# Patient Record
Sex: Male | Born: 1955
Health system: Southern US, Community
[De-identification: ages and names within clinical notes are randomized; demographics above are authoritative.]

## PROBLEM LIST (undated history)

## (undated) DIAGNOSIS — F988 Other specified behavioral and emotional disorders with onset usually occurring in childhood and adolescence: Secondary | ICD-10-CM

## (undated) DIAGNOSIS — B269 Mumps without complication: Secondary | ICD-10-CM

## (undated) DIAGNOSIS — K635 Polyp of colon: Secondary | ICD-10-CM

## (undated) DIAGNOSIS — H9319 Tinnitus, unspecified ear: Secondary | ICD-10-CM

## (undated) DIAGNOSIS — R030 Elevated blood-pressure reading, without diagnosis of hypertension: Secondary | ICD-10-CM

## (undated) DIAGNOSIS — H8110 Benign paroxysmal vertigo, unspecified ear: Secondary | ICD-10-CM

## (undated) DIAGNOSIS — B019 Varicella without complication: Secondary | ICD-10-CM

## (undated) DIAGNOSIS — J301 Allergic rhinitis due to pollen: Secondary | ICD-10-CM

## (undated) DIAGNOSIS — R739 Hyperglycemia, unspecified: Secondary | ICD-10-CM

## (undated) DIAGNOSIS — B059 Measles without complication: Secondary | ICD-10-CM

## (undated) DIAGNOSIS — Z0000 Encounter for general adult medical examination without abnormal findings: Secondary | ICD-10-CM

## (undated) HISTORY — DX: Polyp of colon: K63.5

## (undated) HISTORY — DX: Allergic rhinitis due to pollen: J30.1

## (undated) HISTORY — DX: Varicella without complication: B01.9

## (undated) HISTORY — DX: Elevated blood-pressure reading, without diagnosis of hypertension: R03.0

## (undated) HISTORY — DX: Tinnitus, unspecified ear: H93.19

## (undated) HISTORY — DX: Encounter for general adult medical examination without abnormal findings: Z00.00

## (undated) HISTORY — DX: Measles without complication: B05.9

## (undated) HISTORY — DX: Mumps without complication: B26.9

## (undated) HISTORY — DX: Benign paroxysmal vertigo, unspecified ear: H81.10

## (undated) HISTORY — DX: Other specified behavioral and emotional disorders with onset usually occurring in childhood and adolescence: F98.8

## (undated) HISTORY — DX: Hyperglycemia, unspecified: R73.9

---

## 2002-01-11 HISTORY — PX: OTHER SURGICAL HISTORY: SHX169

## 2005-01-06 ENCOUNTER — Ambulatory Visit: Payer: Self-pay | Admitting: Gastroenterology

## 2005-01-18 ENCOUNTER — Ambulatory Visit: Payer: Self-pay | Admitting: Gastroenterology

## 2009-05-14 ENCOUNTER — Telehealth: Payer: Self-pay | Admitting: Gastroenterology

## 2010-02-10 NOTE — Progress Notes (Signed)
Summary: Schedule Colonoscopy  Phone Note Outgoing Call Call back at [facility] Phone [phone]   Call placed by: Harlow Mares CMA Duncan Dull),  May 14, 2009 11:20 AM Call placed to: Patient Summary of Call: Left message on patients machine to call back. pt due for colonoscopy Initial call taken by: Harlow Mares CMA Duncan Dull),  May 14, 2009 11:20 AM  Follow-up for Phone Call        patient has changed practice Follow-up by: Harlow Mares CMA Duncan Dull),  May 26, 2009 11:38 AM

## 2012-07-24 ENCOUNTER — Ambulatory Visit: Payer: Self-pay | Admitting: Family Medicine

## 2012-07-25 ENCOUNTER — Ambulatory Visit (INDEPENDENT_AMBULATORY_CARE_PROVIDER_SITE_OTHER): Payer: BC Managed Care – PPO | Admitting: Family Medicine

## 2012-07-25 ENCOUNTER — Encounter: Payer: Self-pay | Admitting: Family Medicine

## 2012-07-25 VITALS — BP 138/90 | HR 62 | Temp 97.9°F | Ht 75.0 in | Wt 220.0 lb

## 2012-07-25 DIAGNOSIS — J301 Allergic rhinitis due to pollen: Secondary | ICD-10-CM | POA: Insufficient documentation

## 2012-07-25 DIAGNOSIS — G8929 Other chronic pain: Secondary | ICD-10-CM

## 2012-07-25 DIAGNOSIS — D126 Benign neoplasm of colon, unspecified: Secondary | ICD-10-CM

## 2012-07-25 DIAGNOSIS — Z0000 Encounter for general adult medical examination without abnormal findings: Secondary | ICD-10-CM

## 2012-07-25 DIAGNOSIS — J309 Allergic rhinitis, unspecified: Secondary | ICD-10-CM

## 2012-07-25 DIAGNOSIS — M25569 Pain in unspecified knee: Secondary | ICD-10-CM

## 2012-07-25 DIAGNOSIS — K635 Polyp of colon: Secondary | ICD-10-CM | POA: Insufficient documentation

## 2012-07-25 HISTORY — DX: Encounter for general adult medical examination without abnormal findings: Z00.00

## 2012-07-25 MED ORDER — IBUPROFEN 200 MG PO TABS: 200.0000 mg | ORAL_TABLET | Freq: Four times a day (QID) | ORAL | Status: AC | PRN

## 2012-07-25 NOTE — Patient Instructions (Addendum)
Krill oil cap daily MegaRed cap daily by [employer] Add an Aspirin Enteric coated 81 mg daily Salon Pas patches or cream  Preventive Care for Adults, Male A healthy lifestyle and preventive care can promote health and wellness. Preventive health guidelines for men include the following key practices:  A routine yearly physical is a good way to check with your caregiver about your health and preventative screening. It is a chance to share any concerns and updates on your health, and to receive a thorough exam.  Visit your dentist for a routine exam and preventative care every 6 months. Brush your teeth twice a day and floss once a day. Good oral hygiene prevents tooth decay and gum disease.  The frequency of eye exams is based on your age, health, family medical history, use of contact lenses, and other factors. Follow your caregiver's recommendations for frequency of eye exams.  Eat a healthy diet. Foods like vegetables, fruits, whole grains, low-fat dairy products, and lean protein foods contain the nutrients you need without too many calories. Decrease your intake of foods high in solid fats, added sugars, and salt. Eat the right amount of calories for you.Get information about a proper diet from your caregiver, if necessary.  Regular physical exercise is one of the most important things you can do for your health. Most adults should get at least 150 minutes of moderate-intensity exercise (any activity that increases your heart rate and causes you to sweat) each week. In addition, most adults need muscle-strengthening exercises on 2 or more days a week.  Maintain a healthy weight. The body mass index (BMI) is a screening tool to identify possible weight problems. It provides an estimate of body fat based on height and weight. Your caregiver can help determine your BMI, and can help you achieve or maintain a healthy weight.For adults 20 years and older:  A BMI below 18.5 is considered  underweight.  A BMI of 18.5 to 24.9 is normal.  A BMI of 25 to 29.9 is considered overweight.  A BMI of 30 and above is considered obese.  Maintain normal blood lipids and cholesterol levels by exercising and minimizing your intake of saturated fat. Eat a balanced diet with plenty of fruit and vegetables. Blood tests for lipids and cholesterol should begin at age 39 and be repeated every 5 years. If your lipid or cholesterol levels are high, you are over 50, or you are a high risk for heart disease, you may need your cholesterol levels checked more frequently.Ongoing high lipid and cholesterol levels should be treated with medicines if diet and exercise are not effective.  If you smoke, find out from your caregiver how to quit. If you do not use tobacco, do not start.  If you choose to drink alcohol, do not exceed 2 drinks per day. One drink is considered to be 12 ounces (355 mL) of beer, 5 ounces (148 mL) of wine, or 1.5 ounces (44 mL) of liquor.  Avoid use of street drugs. Do not share needles with anyone. Ask for help if you need support or instructions about stopping the use of drugs.  High blood pressure causes heart disease and increases the risk of stroke. Your blood pressure should be checked at least every 1 to 2 years. Ongoing high blood pressure should be treated with medicines, if weight loss and exercise are not effective.  If you are 44 to 59 years old, ask your caregiver if you should take aspirin to prevent heart disease.  Diabetes screening involves taking a blood sample to check your fasting blood sugar level. This should be done once every 3 years, after age 4, if you are within normal weight and without risk factors for diabetes. Testing should be considered at a younger age or be carried out more frequently if you are overweight and have at least 1 risk factor for diabetes.  Colorectal cancer can be detected and often prevented. Most routine colorectal cancer screening  begins at the age of 42 and continues through age 45. However, your caregiver may recommend screening at an earlier age if you have risk factors for colon cancer. On a yearly basis, your caregiver may provide home test kits to check for hidden blood in the stool. Use of a small camera at the end of a tube, to directly examine the colon (sigmoidoscopy or colonoscopy), can detect the earliest forms of colorectal cancer. Talk to your caregiver about this at age 68, when routine screening begins. Direct examination of the colon should be repeated every 5 to 10 years through age 34, unless early forms of pre-cancerous polyps or small growths are found.  Hepatitis C blood testing is recommended for all people born from [birth year] through 1965 and any individual with known risks for hepatitis C.  Practice safe sex. Use condoms and avoid high-risk sexual practices to reduce the spread of sexually transmitted infections (STIs). STIs include gonorrhea, chlamydia, syphilis, trichomonas, herpes, HPV, and human immunodeficiency virus (HIV). Herpes, HIV, and HPV are viral illnesses that have no cure. They can result in disability, cancer, and death.  A one-time screening for abdominal aortic aneurysm (AAA) and surgical repair of large AAAs by sound wave imaging (ultrasonography) is recommended for ages 21 to 69 years who are current or former smokers.  Healthy men should no longer receive prostate-specific antigen (PSA) blood tests as part of routine cancer screening. Consult with your caregiver about prostate cancer screening.  Testicular cancer screening is not recommended for adult males who have no symptoms. Screening includes self-exam, caregiver exam, and other screening tests. Consult with your caregiver about any symptoms you have or any concerns you have about testicular cancer.  Use sunscreen with skin protection factor (SPF) of 30 or more. Apply sunscreen liberally and repeatedly throughout the day. You should  seek shade when your shadow is shorter than you. Protect yourself by wearing long sleeves, pants, a wide-brimmed hat, and sunglasses year round, whenever you are outdoors.  Once a month, do a whole body skin exam, using a mirror to look at the skin on your back. Notify your caregiver of new moles, moles that have irregular borders, moles that are larger than a pencil eraser, or moles that have changed in shape or color.  Stay current with required immunizations.  Influenza. You need a dose every fall (or winter). The composition of the flu vaccine changes each year, so being vaccinated once is not enough.  Pneumococcal polysaccharide. You need 1 to 2 doses if you smoke cigarettes or if you have certain chronic medical conditions. You need 1 dose at age 52 (or older) if you have never been vaccinated.  Tetanus, diphtheria, pertussis (Tdap, Td). Get 1 dose of Tdap vaccine if you are younger than age 26 years, are over 25 and have contact with an infant, are a Research scientist (physical sciences), or simply want to be protected from whooping cough. After that, you need a Td booster dose every 10 years. Consult your caregiver if you have not had at least  3 tetanus and diphtheria-containing shots sometime in your life or have a deep or dirty wound.  HPV. This vaccine is recommended for males 13 through 59 years of age. This vaccine may be given to men 22 through 59 years of age who have not completed the 3 dose series. It is recommended for men through age 68 who have sex with men or whose immune system is weakened because of HIV infection, other illness, or medications. The vaccine is given in 3 doses over 6 months.  Measles, mumps, rubella (MMR). You need at least 1 dose of MMR if you were born in 1957 or later. You may also need a 2nd dose.  Meningococcal. If you are age 55 to 65 years and a Orthoptist living in a residence hall, or have one of several medical conditions, you need to get vaccinated  against meningococcal disease. You may also need additional booster doses.  Zoster (shingles). If you are age 75 years or older, you should get this vaccine.  Varicella (chickenpox). If you have never had chickenpox or you were vaccinated but received only 1 dose, talk to your caregiver to find out if you need this vaccine.  Hepatitis A. You need this vaccine if you have a specific risk factor for hepatitis A virus infection, or you simply wish to be protected from this disease. The vaccine is usually given as 2 doses, 6 to 18 months apart.  Hepatitis B. You need this vaccine if you have a specific risk factor for hepatitis B virus infection or you simply wish to be protected from this disease. The vaccine is given in 3 doses, usually over 6 months. Preventative Service / Frequency Ages 8 to 85  Blood pressure check.** / Every 1 to 2 years.  Lipid and cholesterol check.** / Every 5 years beginning at age 39.  Hepatitis C blood test.** / For any individual with known risks for hepatitis C.  Skin self-exam. / Monthly.  Influenza immunization.** / Every year.  Pneumococcal polysaccharide immunization.** / 1 to 2 doses if you smoke cigarettes or if you have certain chronic medical conditions.  Tetanus, diphtheria, pertussis (Tdap,Td) immunization. / A one-time dose of Tdap vaccine. After that, you need a Td booster dose every 10 years.  HPV immunization. / 3 doses over 6 months, if 26 and younger.  Measles, mumps, rubella (MMR) immunization. / You need at least 1 dose of MMR if you were born in 1957 or later. You may also need a 2nd dose.  Meningococcal immunization. / 1 dose if you are age 50 to 60 years and a Orthoptist living in a residence hall, or have one of several medical conditions, you need to get vaccinated against meningococcal disease. You may also need additional booster doses.  Varicella immunization.** / Consult your caregiver.  Hepatitis A  immunization.** / Consult your caregiver. 2 doses, 6 to 18 months apart.  Hepatitis B immunization.** / Consult your caregiver. 3 doses usually over 6 months. Ages 34 to 48  Blood pressure check.** / Every 1 to 2 years.  Lipid and cholesterol check.** / Every 5 years beginning at age 78.  Fecal occult blood test (FOBT) of stool. / Every year beginning at age 33 and continuing until age 69. You may not have to do this test if you get colonoscopy every 10 years.  Flexible sigmoidoscopy** or colonoscopy.** / Every 5 years for a flexible sigmoidoscopy or every 10 years for a colonoscopy beginning at age  50 and continuing until age 13.  Hepatitis C blood test.** / For all people born from [birth year] through 1965 and any individual with known risks for hepatitis C.  Skin self-exam. / Monthly.  Influenza immunization.** / Every year.  Pneumococcal polysaccharide immunization.** / 1 to 2 doses if you smoke cigarettes or if you have certain chronic medical conditions.  Tetanus, diphtheria, pertussis (Tdap/Td) immunization.** / A one-time dose of Tdap vaccine. After that, you need a Td booster dose every 10 years.  Measles, mumps, rubella (MMR) immunization. / You need at least 1 dose of MMR if you were born in 1957 or later. You may also need a 2nd dose.  Varicella immunization.**/ Consult your caregiver.  Meningococcal immunization.** / Consult your caregiver.  Hepatitis A immunization.** / Consult your caregiver. 2 doses, 6 to 18 months apart.  Hepatitis B immunization.** / Consult your caregiver. 3 doses, usually over 6 months. Ages 48 and over  Blood pressure check.** / Every 1 to 2 years.  Lipid and cholesterol check.**/ Every 5 years beginning at age 30.  Fecal occult blood test (FOBT) of stool. / Every year beginning at age 70 and continuing until age 29. You may not have to do this test if you get colonoscopy every 10 years.  Flexible sigmoidoscopy** or colonoscopy.** / Every 5  years for a flexible sigmoidoscopy or every 10 years for a colonoscopy beginning at age 77 and continuing until age 8.  Hepatitis C blood test.** / For all people born from [birth year] through 1967 and any individual with known risks for hepatitis C.  Abdominal aortic aneurysm (AAA) screening.** / A one-time screening for ages 65 to 65 years who are current or former smokers.  Skin self-exam. / Monthly.  Influenza immunization.** / Every year.  Pneumococcal polysaccharide immunization.** / 1 dose at age 56 (or older) if you have never been vaccinated.  Tetanus, diphtheria, pertussis (Tdap, Td) immunization. / A one-time dose of Tdap vaccine if you are over 65 and have contact with an infant, are a Research scientist (physical sciences), or simply want to be protected from whooping cough. After that, you need a Td booster dose every 10 years.  Varicella immunization. ** / Consult your caregiver.  Meningococcal immunization.** / Consult your caregiver.  Hepatitis A immunization. ** / Consult your caregiver. 2 doses, 6 to 18 months apart.  Hepatitis B immunization.** / Check with your caregiver. 3 doses, usually over 6 months. **Family history and personal history of risk and conditions may change your caregiver's recommendations. Document Released: 02/23/2001 Document Revised: 03/22/2011 Document Reviewed: 05/25/2010 [facility] Patient Information 2014 [place], [place].

## 2012-07-25 NOTE — Progress Notes (Signed)
Patient ID: Austin Macdonald, male   DOB: 09/09/1955, 57 y.o.   MRN: [id number] Austin Macdonald [id number] 04/19/1955 07/25/2012      Progress Note New Patient  Subjective  Chief Complaint  Chief Complaint  Patient presents with  . Establish Care    new patient    HPI  Patient is a 57 -year-old Caucasian male who is in today to establish care. He feels well and offers no acute recent complaints. He struggles with seasonal allergies but they're well controlled at this time. Has a good response to loratadine when he needs them. He had his first colonoscopy age 33 at which time they found a few benign polyps. His last colonoscopy was about 3 years ago in 2011 and he was told it was clear and he would need a repeat in 10 years. He denies any GI or GU complaints. His work place check Hemoccults annually. No chest pain, palpitations, shortness of breath, GI or GU concerns. He questions whether ADD is an issue for him and notes his family gets frustrated at his inability to finish tasks in a timely manner. The symptoms are not affecting his employment otherwise.   Past Medical History  Diagnosis Date  . Chicken pox as a child  . Measles as a child  . Mumps as a child  . Colon polyps   . Hay fever     seasonal, spring, fall  . Preventative health care 07/25/2012    Past Surgical History  Procedure Laterality Date  . Knee arthroscopic repair  2004    right    Family History  Problem Relation Age of Onset  . Hypertension Mother   . Other Paternal Grandfather     black lung  . Thyroid cancer Daughter   . Dementia Father     History   Social History  . Marital Status: Married    Spouse Name: N/A    Number of Children: N/A  . Years of Education: N/A   Occupational History  . Not on file.   Social History Main Topics  . Smoking status: Current Every Day Smoker -- 0.50 packs/day for 2 years    Types: Cigarettes    Start date: 01/12/1972  . Smokeless tobacco: Never Used  .  Alcohol Use: Yes     Comment: 10-12 drinks weekly  . Drug Use: No  . Sexually Active: Yes   Other Topics Concern  . Not on file   Social History Narrative  . No narrative on file    No current outpatient prescriptions on file prior to visit.   No current facility-administered medications on file prior to visit.    No Known Allergies  Review of Systems  Review of Systems  Constitutional: Negative for fever, chills and malaise/fatigue.  HENT: Negative for hearing loss, nosebleeds and congestion.   Eyes: Negative for discharge.  Respiratory: Negative for cough, sputum production, shortness of breath and wheezing.   Cardiovascular: Negative for chest pain, palpitations and leg swelling.  Gastrointestinal: Negative for heartburn, nausea, vomiting, abdominal pain, diarrhea, constipation and blood in stool.  Genitourinary: Negative for dysuria, urgency, frequency and hematuria.  Musculoskeletal: Negative for myalgias, back pain and falls.  Skin: Negative for rash.  Neurological: Negative for dizziness, tremors, sensory change, focal weakness, loss of consciousness, weakness and headaches.  Endo/Heme/Allergies: Negative for polydipsia. Does not bruise/bleed easily.  Psychiatric/Behavioral: Negative for depression and suicidal ideas. The patient is not nervous/anxious and does not have insomnia.  Objective  BP 138/90  Pulse 62  Temp(Src) 97.9 F (36.6 C) (Oral)  Ht 6\' 3"  (1.905 m)  Wt 220 lb (99.791 kg)  BMI 27.5 kg/m2  SpO2 94%  Physical Exam  Physical Exam  Constitutional: He is oriented to person, place, and time and well-developed, well-nourished, and in no distress. No distress.  HENT:  Head: Normocephalic and atraumatic.  Eyes: Conjunctivae are normal.  Neck: Neck supple. No thyromegaly present.  Cardiovascular: Normal rate, regular rhythm and normal heart sounds.  Exam reveals no gallop.   No murmur heard. Pulmonary/Chest: Effort normal and breath sounds  normal. No respiratory distress.  Abdominal: He exhibits no distension and no mass. There is no tenderness.  Musculoskeletal: He exhibits no edema.  Neurological: He is alert and oriented to person, place, and time.  Skin: Skin is warm.  Psychiatric: Memory, affect and judgment normal.       Assessment & Plan  Preventative health care Encouraged heart healthy diet, exercises regularly. Does annual labs and hemeocults through his work place. Will bring [place] a copy of his next set of labs. Patient questioning whether he struggles with ADD, He reports always having trouble completing tasks. Is given paper work on [employer] health if he is interested in having this evaluated more fully  Colon polyps Up to date, no GI c/o today  Hay fever Uses Loratadine daily prn with good results

## 2012-07-26 NOTE — Assessment & Plan Note (Signed)
Uses Loratadine daily prn with good results

## 2012-07-26 NOTE — Assessment & Plan Note (Signed)
Encouraged heart healthy diet, exercises regularly. Does annual labs and hemeocults through his work place. Will bring [place] a copy of his next set of labs. Patient questioning whether he struggles with ADD, He reports always having trouble completing tasks. Is given paper work on [employer] if he is interested in having this evaluated more fully

## 2012-07-26 NOTE — Assessment & Plan Note (Signed)
Up to date, no GI c/o today

## 2012-08-14 ENCOUNTER — Ambulatory Visit: Payer: BC Managed Care – PPO | Admitting: Licensed Clinical Social Worker

## 2013-12-31 ENCOUNTER — Other Ambulatory Visit: Payer: Self-pay | Admitting: Gastroenterology

## 2014-12-26 ENCOUNTER — Encounter: Payer: Self-pay | Admitting: Family Medicine

## 2014-12-26 ENCOUNTER — Ambulatory Visit (INDEPENDENT_AMBULATORY_CARE_PROVIDER_SITE_OTHER): Payer: BLUE CROSS/BLUE SHIELD | Admitting: Family Medicine

## 2014-12-26 VITALS — BP 130/80 | HR 67 | Temp 98.2°F | Ht 75.0 in | Wt 217.0 lb

## 2014-12-26 DIAGNOSIS — H8110 Benign paroxysmal vertigo, unspecified ear: Secondary | ICD-10-CM | POA: Diagnosis not present

## 2014-12-26 DIAGNOSIS — L989 Disorder of the skin and subcutaneous tissue, unspecified: Secondary | ICD-10-CM

## 2014-12-26 DIAGNOSIS — J301 Allergic rhinitis due to pollen: Secondary | ICD-10-CM

## 2014-12-26 DIAGNOSIS — H9319 Tinnitus, unspecified ear: Secondary | ICD-10-CM

## 2014-12-26 DIAGNOSIS — Z23 Encounter for immunization: Secondary | ICD-10-CM | POA: Diagnosis not present

## 2014-12-26 DIAGNOSIS — R413 Other amnesia: Secondary | ICD-10-CM | POA: Diagnosis not present

## 2014-12-26 DIAGNOSIS — Z0000 Encounter for general adult medical examination without abnormal findings: Secondary | ICD-10-CM

## 2014-12-26 HISTORY — DX: Benign paroxysmal vertigo, unspecified ear: H81.10

## 2014-12-26 MED ORDER — ZOSTER VACCINE LIVE 19400 UNT/0.65ML ~~LOC~~ SOLR: 0.6500 mL | Freq: Once | SUBCUTANEOUS | Status: DC

## 2014-12-26 NOTE — Patient Instructions (Addendum)
Typical healthy needs 64 oz of clear fluids daily For allergies claritin once to twice daily especially when traveling, nasal saline as needed and flonase as needed  Vertigo Vertigo means you feel like you or your surroundings are moving when they are not. Vertigo can be dangerous if it occurs when you are at work, driving, or performing difficult activities.  CAUSES  Vertigo occurs when there is a conflict of signals sent to your brain from the visual and sensory systems in your body. There are many different causes of vertigo, including:  Infections, especially in the inner ear.  A bad reaction to a drug or misuse of alcohol and medicines.  Withdrawal from drugs or alcohol.  Rapidly changing positions, such as lying down or rolling over in bed.  A migraine headache.  Decreased blood flow to the brain.  Increased pressure in the brain from a head injury, infection, tumor, or bleeding. SYMPTOMS  You may feel as though the world is spinning around or you are falling to the ground. Because your balance is upset, vertigo can cause nausea and vomiting. You may have involuntary eye movements (nystagmus). DIAGNOSIS  Vertigo is usually diagnosed by physical exam. If the cause of your vertigo is unknown, your caregiver may perform imaging tests, such as an MRI scan (magnetic resonance imaging). TREATMENT  Most cases of vertigo resolve on their own, without treatment. Depending on the cause, your caregiver may prescribe certain medicines. If your vertigo is related to body position issues, your caregiver may recommend movements or procedures to correct the problem. In rare cases, if your vertigo is caused by certain inner ear problems, you may need surgery. HOME CARE INSTRUCTIONS   Follow your caregiver's instructions.  Avoid driving.  Avoid operating heavy machinery.  Avoid performing any tasks that would be dangerous to you or others during a vertigo episode.  Tell your caregiver if you  notice that certain medicines seem to be causing your vertigo. Some of the medicines used to treat vertigo episodes can actually make them worse in some people. SEEK IMMEDIATE MEDICAL CARE IF:   Your medicines do not relieve your vertigo or are making it worse.  You develop problems with talking, walking, weakness, or using your arms, hands, or legs.  You develop severe headaches.  Your nausea or vomiting continues or gets worse.  You develop visual changes.  A family member notices behavioral changes.  Your condition gets worse. MAKE SURE YOU:  Understand these instructions.  Will watch your condition.  Will get help right away if you are not doing well or get worse.   This information is not intended to replace advice given to you by your health care provider. Make sure you discuss any questions you have with your health care provider.   Document Released: 10/07/2004 Document Revised: 03/22/2011 Document Reviewed: 04/22/2014 Elsevier Interactive Patient Education [employer].

## 2014-12-26 NOTE — Progress Notes (Signed)
Pre visit review using our clinic review tool, if applicable. No additional management support is needed unless otherwise documented below in the visit note. 

## 2014-12-27 LAB — HEPATITIS C ANTIBODY: HCV AB: NEGATIVE

## 2014-12-29 ENCOUNTER — Encounter: Payer: Self-pay | Admitting: Family Medicine

## 2014-12-29 DIAGNOSIS — R413 Other amnesia: Secondary | ICD-10-CM | POA: Insufficient documentation

## 2014-12-29 DIAGNOSIS — L989 Disorder of the skin and subcutaneous tissue, unspecified: Secondary | ICD-10-CM | POA: Insufficient documentation

## 2014-12-29 DIAGNOSIS — H9319 Tinnitus, unspecified ear: Secondary | ICD-10-CM

## 2014-12-29 HISTORY — DX: Tinnitus, unspecified ear: H93.19

## 2014-12-29 NOTE — Assessment & Plan Note (Signed)
Persistent and long standing. Has had a work up with audiology and ENT in past, no new concerns.

## 2014-12-29 NOTE — Assessment & Plan Note (Signed)
Immunizations given today, he agrees to supply [place] with labs from his work. He reports they are normal. Hep C screen is negative

## 2014-12-29 NOTE — Progress Notes (Signed)
Subjective:    Patient ID: Austin Macdonald, male    DOB: [birth year], 59 y.o.   MRN: [id number]  Chief Complaint  Patient presents with  . Follow-up    HPI Patient is in today for evaluation of a couple of concerns. He has not been seen in several years. He is concerned about the possibility of ADD he is having more trouble completing tasks at home and at work has had trouble for many years. No recent illness. Does note some mild sense of disequilibrium recently. Acknowledges dehydration and poor fluid intake. No fever but did have some congestion intermittently the past month to 2. He has a long history of tinnitus that has been worked up in past by specialists and no concerns have been noted. Denies CP/palp/SOB/HA/congestion/fevers/GI or GU c/o. Taking meds as prescribed  Past Medical History  Diagnosis Date  . Chicken pox as a child  . Measles as a child  . Mumps as a child  . Colon polyps   . Hay fever     seasonal, spring, fall  . Preventative health care 07/25/2012  . Benign paroxysmal positional vertigo 12/26/2014  . Tinnitus 12/29/2014    Past Surgical History  Procedure Laterality Date  . Knee arthroscopic repair  2004    right    Family History  Problem Relation Age of Onset  . Hypertension Mother   . Other Paternal Grandfather     black lung  . Thyroid cancer Daughter   . Dementia Father     Social History   Social History  . Marital Status: Married    Spouse Name: N/A  . Number of Children: N/A  . Years of Education: N/A   Occupational History  . Not on file.   Social History Main Topics  . Smoking status: Current Every Day Smoker -- 0.50 packs/day for 2 years    Types: Cigarettes    Start date: 01/12/1972  . Smokeless tobacco: Never Used  . Alcohol Use: Yes     Comment: 10-12 drinks weekly  . Drug Use: No  . Sexual Activity: Yes   Other Topics Concern  . Not on file   Social History Narrative    Outpatient Prescriptions Prior to Visit    Medication Sig Dispense Refill  . ibuprofen (ADVIL) 200 MG tablet Take 1 tablet (200 mg total) by mouth every 6 (six) hours as needed for pain. 30 tablet 0  . loratadine (CLARITIN) 10 MG tablet Take 10 mg by mouth daily.     No facility-administered medications prior to visit.    No Known Allergies  Review of Systems  Constitutional: Negative for fever and malaise/fatigue.  HENT: Positive for tinnitus. Negative for congestion.   Eyes: Negative for discharge.  Respiratory: Negative for shortness of breath.   Cardiovascular: Negative for chest pain, palpitations and leg swelling.  Gastrointestinal: Negative for nausea and abdominal pain.  Genitourinary: Negative for dysuria.  Musculoskeletal: Negative for falls.  Skin: Negative for rash.  Neurological: Positive for dizziness. Negative for loss of consciousness and headaches.  Endo/Heme/Allergies: Negative for environmental allergies.  Psychiatric/Behavioral: Positive for memory loss. Negative for depression. The patient is not nervous/anxious.        Objective:    Physical Exam  Constitutional: He is oriented to person, place, and time. He appears well-developed and well-nourished. No distress.  HENT:  Head: Normocephalic and atraumatic.  Nose: Nose normal.  Eyes: Right eye exhibits no discharge. Left eye exhibits no discharge.  Neck: Normal  range of motion. Neck supple.  Cardiovascular: Normal rate and regular rhythm.   No murmur heard. Pulmonary/Chest: Effort normal and breath sounds normal.  Abdominal: Soft. Bowel sounds are normal. There is no tenderness.  Musculoskeletal: He exhibits no edema.  Neurological: He is alert and oriented to person, place, and time. No cranial nerve deficit.  One beat of nystagmus noted. All other CN intact  Skin: Skin is warm and dry.  Psychiatric: He has a normal mood and affect.  Nursing note and vitals reviewed.   BP 130/80 mmHg  Pulse 67  Temp(Src) 98.2 F (36.8 C) (Other  (Comment))  Ht '6\' 3"'$  (1.905 m)  Wt 217 lb (98.431 kg)  BMI 27.12 kg/m2  SpO2 96% Wt Readings from Last 3 Encounters:  12/26/14 217 lb (98.431 kg)  07/25/12 220 lb (99.791 kg)     No results found for: WBC, HGB, HCT, PLT, GLUCOSE, CHOL, TRIG, HDL, LDLDIRECT, LDLCALC, ALT, AST, NA, K, CL, CREATININE, BUN, CO2, TSH, PSA, INR, GLUF, HGBA1C, MICROALBUR  No results found for: TSH No results found for: WBC, HGB, HCT, MCV, PLT No results found for: NA, K, CHLORIDE, CO2, GLUCOSE, BUN, CREATININE, BILITOT, ALKPHOS, AST, ALT, PROT, ALBUMIN, CALCIUM, ANIONGAP, EGFR, GFR No results found for: CHOL No results found for: HDL No results found for: LDLCALC No results found for: TRIG No results found for: CHOLHDL No results found for: HGBA1C     Assessment & Plan:   Problem List Items Addressed This Visit    Benign paroxysmal positional vertigo    Slight, likely related to recent viral URI and dehydration. He agrees to increase hydration and report if symptoms worsen.      Hay fever   Memory difficulties - Primary    Patient c/o difficulty concentrating interested in an evaluation for adult ADD, referred to behavioral health for further consideration.      Relevant Orders   Ambulatory referral to [facility]    Immunizations given today, he agrees to supply [place] with labs from his work. He reports they are normal. Hep C screen is negative      Skin lesion    Chest and head, he is referred to dermatology for evaluation      Relevant Orders   Ambulatory referral to Dermatology   Tinnitus    Persistent and long standing. Has had a work up with audiology and ENT in past, no new concerns.       Other Visit Diagnoses    Need for viral immunization        Relevant Medications    zoster vaccine live, PF, (ZOSTAVAX) 01749 UNT/0.65ML injection    Other Relevant Orders    Hepatitis C antibody (Completed)    Need for zoster vaccination        Relevant  Orders    Varicella-zoster vaccine subcutaneous (Completed)       I am having Mr. Vosler start on zoster vaccine live (PF). I am also having him maintain his loratadine and ibuprofen.  Meds ordered this encounter  Medications  . zoster vaccine live, PF, (ZOSTAVAX) 44967 UNT/0.65ML injection    Sig: Inject 19,400 Units into the skin once.    Dispense:  1 each    Refill:  0     Penni Homans, MD

## 2014-12-29 NOTE — Assessment & Plan Note (Signed)
Patient c/o difficulty concentrating interested in an evaluation for adult ADD, referred to behavioral health for further consideration.

## 2014-12-29 NOTE — Assessment & Plan Note (Signed)
Chest and head, he is referred to dermatology for evaluation

## 2014-12-29 NOTE — Assessment & Plan Note (Signed)
Slight, likely related to recent viral URI and dehydration. He agrees to increase hydration and report if symptoms worsen.

## 2015-01-02 ENCOUNTER — Encounter: Payer: Self-pay | Admitting: Family Medicine

## 2015-03-03 ENCOUNTER — Encounter: Payer: Self-pay | Admitting: Family Medicine

## 2015-03-03 ENCOUNTER — Ambulatory Visit (INDEPENDENT_AMBULATORY_CARE_PROVIDER_SITE_OTHER): Payer: BLUE CROSS/BLUE SHIELD | Admitting: Family Medicine

## 2015-03-03 VITALS — BP 138/84 | HR 63 | Temp 97.8°F | Ht 75.0 in | Wt 218.2 lb

## 2015-03-03 DIAGNOSIS — I10 Essential (primary) hypertension: Secondary | ICD-10-CM | POA: Insufficient documentation

## 2015-03-03 DIAGNOSIS — Z23 Encounter for immunization: Secondary | ICD-10-CM

## 2015-03-03 DIAGNOSIS — R739 Hyperglycemia, unspecified: Secondary | ICD-10-CM | POA: Diagnosis not present

## 2015-03-03 DIAGNOSIS — R030 Elevated blood-pressure reading, without diagnosis of hypertension: Secondary | ICD-10-CM | POA: Diagnosis not present

## 2015-03-03 DIAGNOSIS — Z7189 Other specified counseling: Secondary | ICD-10-CM | POA: Diagnosis not present

## 2015-03-03 DIAGNOSIS — Z7184 Encounter for health counseling related to travel: Secondary | ICD-10-CM

## 2015-03-03 DIAGNOSIS — IMO0001 Reserved for inherently not codable concepts without codable children: Secondary | ICD-10-CM

## 2015-03-03 HISTORY — DX: Reserved for inherently not codable concepts without codable children: IMO0001

## 2015-03-03 HISTORY — DX: Hyperglycemia, unspecified: R73.9

## 2015-03-03 NOTE — Assessment & Plan Note (Addendum)
Drinks a significant amount of caffeine, encouraged to diminish intake and monitor numbers. Seek care if any symptoms occur. Also minimize sodium.

## 2015-03-03 NOTE — Progress Notes (Signed)
Pre visit review using our clinic review tool, if applicable. No additional management support is needed unless otherwise documented below in the visit note. 

## 2015-03-03 NOTE — Patient Instructions (Signed)
Hypertension Hypertension, commonly called high blood pressure, is when the force of blood pumping through your arteries is too strong. Your arteries are the blood vessels that carry blood from your heart throughout your body. A blood pressure reading consists of a higher number over a lower number, such as 110/72. The higher number (systolic) is the pressure inside your arteries when your heart pumps. The lower number (diastolic) is the pressure inside your arteries when your heart relaxes. Ideally you want your blood pressure below 120/80. Hypertension forces your heart to work harder to pump blood. Your arteries may become narrow or stiff. Having untreated or uncontrolled hypertension can cause heart attack, stroke, kidney disease, and other problems. RISK FACTORS Some risk factors for high blood pressure are controllable. Others are not.  Risk factors you cannot control include:   Race. You may be at higher risk if you are African American.  Age. Risk increases with age.  Gender. Men are at higher risk than women before age 45 years. After age 65, women are at higher risk than men. Risk factors you can control include:  Not getting enough exercise or physical activity.  Being overweight.  Getting too much fat, sugar, calories, or salt in your diet.  Drinking too much alcohol. SIGNS AND SYMPTOMS Hypertension does not usually cause signs or symptoms. Extremely high blood pressure (hypertensive crisis) may cause headache, anxiety, shortness of breath, and nosebleed. DIAGNOSIS To check if you have hypertension, your health care provider will measure your blood pressure while you are seated, with your arm held at the level of your heart. It should be measured at least twice using the same arm. Certain conditions can cause a difference in blood pressure between your right and left arms. A blood pressure reading that is higher than normal on one occasion does not mean that you need treatment. If  it is not clear whether you have high blood pressure, you may be asked to return on a different day to have your blood pressure checked again. Or, you may be asked to monitor your blood pressure at home for 1 or more weeks. TREATMENT Treating high blood pressure includes making lifestyle changes and possibly taking medicine. Living a healthy lifestyle can help lower high blood pressure. You may need to change some of your habits. Lifestyle changes may include:  Following the DASH diet. This diet is high in fruits, vegetables, and whole grains. It is low in salt, red meat, and added sugars.  Keep your sodium intake below 2,300 mg per day.  Getting at least 30-45 minutes of aerobic exercise at least 4 times per week.  Losing weight if necessary.  Not smoking.  Limiting alcoholic beverages.  Learning ways to reduce stress. Your health care provider may prescribe medicine if lifestyle changes are not enough to get your blood pressure under control, and if one of the following is true:  You are 18-59 years of age and your systolic blood pressure is above 140.  You are 60 years of age or older, and your systolic blood pressure is above 150.  Your diastolic blood pressure is above 90.  You have diabetes, and your systolic blood pressure is over 140 or your diastolic blood pressure is over 90.  You have kidney disease and your blood pressure is above 140/90.  You have heart disease and your blood pressure is above 140/90. Your personal target blood pressure may vary depending on your medical conditions, your age, and other factors. HOME CARE INSTRUCTIONS    Have your blood pressure rechecked as directed by your health care provider.   Take medicines only as directed by your health care provider. Follow the directions carefully. Blood pressure medicines must be taken as prescribed. The medicine does not work as well when you skip doses. Skipping doses also puts you at risk for  problems.  Do not smoke.   Monitor your blood pressure at home as directed by your health care provider. SEEK MEDICAL CARE IF:   You think you are having a reaction to medicines taken.  You have recurrent headaches or feel dizzy.  You have swelling in your ankles.  You have trouble with your vision. SEEK IMMEDIATE MEDICAL CARE IF:  You develop a severe headache or confusion.  You have unusual weakness, numbness, or feel faint.  You have severe chest or abdominal pain.  You vomit repeatedly.  You have trouble breathing. MAKE SURE YOU:   Understand these instructions.  Will watch your condition.  Will get help right away if you are not doing well or get worse.   This information is not intended to replace advice given to you by your health care provider. Make sure you discuss any questions you have with your health care provider.   Document Released: 12/28/2004 Document Revised: 05/14/2014 Document Reviewed: 10/20/2012 Elsevier Interactive Patient Education 2016 [employer] Inc[employer]  

## 2015-03-04 ENCOUNTER — Telehealth: Payer: Self-pay | Admitting: Family Medicine

## 2015-03-04 LAB — HEMOGLOBIN A1C: Hgb A1c MFr Bld: 5.9 % (ref 4.6–6.5)

## 2015-03-04 MED ORDER — SCOPOLAMINE 1 MG/3DAYS TD PT72: 1.0000 | MEDICATED_PATCH | TRANSDERMAL | Status: DC

## 2015-03-04 NOTE — Telephone Encounter (Signed)
OK to order Scopalamine patch place behind ear change every 72 hours. Disp #2 patches

## 2015-03-04 NOTE — Telephone Encounter (Signed)
Sent in patches and patient aware.

## 2015-03-04 NOTE — Telephone Encounter (Signed)
The patient is in need of a sea sickness patch sent in to [facility] in [facility]  The patient is helping a friend move his sail boat from one location to another and they will be on the water for approximately 4 days.  His friend suggested he would need this.   Ok to sent to [facility] in [place].

## 2015-03-16 DIAGNOSIS — Z7184 Encounter for health counseling related to travel: Secondary | ICD-10-CM | POA: Insufficient documentation

## 2015-03-16 NOTE — Progress Notes (Signed)
Patient ID: Austin Macdonald, male   DOB: 1956-01-10, 60 y.o.   MRN: [id number]   Subjective:    Patient ID: Austin Macdonald, male    DOB: [birth year], 60 y.o.   MRN: [id number]  Chief Complaint  Patient presents with  . Follow-up    HPI Patient is in today for follow up. He feels well today, no recent illness or acute concerns. Is heading out on a vacation that will involve some boat travel and is requesting a prescription for motion sickness. Denies CP/palp/SOB/HA/congestion/fevers/GI or GU c/o. Taking meds as prescribed  Past Medical History  Diagnosis Date  . Chicken pox as a child  . Measles as a child  . Mumps as a child  . Colon polyps   . Hay fever     seasonal, spring, fall  . Preventative health care 07/25/2012  . Benign paroxysmal positional vertigo 12/26/2014  . Tinnitus 12/29/2014  . Hyperglycemia 03/03/2015  . Elevated BP 03/03/2015    Past Surgical History  Procedure Laterality Date  . Knee arthroscopic repair  2004    right    Family History  Problem Relation Age of Onset  . Hypertension Mother   . Other Paternal Grandfather     black lung  . Thyroid cancer Daughter   . Dementia Father     Social History   Social History  . Marital Status: Married    Spouse Name: N/A  . Number of Children: N/A  . Years of Education: N/A   Occupational History  . Not on file.   Social History Main Topics  . Smoking status: Never Smoker   . Smokeless tobacco: Never Used  . Alcohol Use: 0.0 oz/week    0 Standard drinks or equivalent per week     Comment: 10-12 drinks weekly  . Drug Use: No  . Sexual Activity: Yes     Comment: lives with wife, travels for work [place], no major dietary restrictions   Other Topics Concern  . Not on file   Social History Narrative    Outpatient Prescriptions Prior to Visit  Medication Sig Dispense Refill  . ibuprofen (ADVIL) 200 MG tablet Take 1 tablet (200 mg total) by mouth every 6 (six) hours as needed for pain. 30  tablet 0  . loratadine (CLARITIN) 10 MG tablet Take 10 mg by mouth daily.    Marland Kitchen zoster vaccine live, PF, (ZOSTAVAX) 54008 UNT/0.65ML injection Inject 19,400 Units into the skin once. 1 each 0   No facility-administered medications prior to visit.    No Known Allergies  Review of Systems  Constitutional: Negative for fever and malaise/fatigue.  HENT: Negative for congestion.   Eyes: Negative for discharge.  Respiratory: Negative for shortness of breath.   Cardiovascular: Negative for chest pain, palpitations and leg swelling.  Gastrointestinal: Negative for nausea and abdominal pain.  Genitourinary: Negative for dysuria.  Musculoskeletal: Negative for falls.  Skin: Negative for rash.  Neurological: Negative for loss of consciousness and headaches.  Endo/Heme/Allergies: Negative for environmental allergies.  Psychiatric/Behavioral: Negative for depression. The patient is not nervous/anxious.        Objective:    Physical Exam  Constitutional: He is oriented to person, place, and time. He appears well-developed and well-nourished. No distress.  HENT:  Head: Normocephalic and atraumatic.  Nose: Nose normal.  Eyes: Right eye exhibits no discharge. Left eye exhibits no discharge.  Neck: Normal range of motion. Neck supple.  Cardiovascular: Normal rate and regular rhythm.   No murmur  heard. Pulmonary/Chest: Effort normal and breath sounds normal.  Abdominal: Soft. Bowel sounds are normal. There is no tenderness.  Musculoskeletal: He exhibits no edema.  Neurological: He is alert and oriented to person, place, and time.  Skin: Skin is warm and dry.  Psychiatric: He has a normal mood and affect.  Nursing note and vitals reviewed.   BP 138/84 mmHg  Pulse 63  Temp(Src) 97.8 F (36.6 C) (Oral)  Ht '6\' 3"'$  (1.905 m)  Wt 218 lb 3.2 oz (98.975 kg)  BMI 27.27 kg/m2  SpO2 97% Wt Readings from Last 3 Encounters:  03/03/15 218 lb 3.2 oz (98.975 kg)  12/26/14 217 lb (98.431 kg)    07/25/12 220 lb (99.791 kg)     Lab Results  Component Value Date   HGBA1C 5.9 03/03/2015    No results found for: TSH No results found for: WBC, HGB, HCT, MCV, PLT No results found for: NA, K, CHLORIDE, CO2, GLUCOSE, BUN, CREATININE, BILITOT, ALKPHOS, AST, ALT, PROT, ALBUMIN, CALCIUM, ANIONGAP, EGFR, GFR No results found for: CHOL No results found for: HDL No results found for: LDLCALC No results found for: TRIG No results found for: CHOLHDL Lab Results  Component Value Date   HGBA1C 5.9 03/03/2015       Assessment & Plan:   Problem List Items Addressed This Visit    Elevated BP    Drinks a significant amount of caffeine, encouraged to diminish intake and monitor numbers. Seek care if any symptoms occur. Also minimize sodium.       Hyperglycemia    hgba1c acceptable, minimize simple carbs. Increase exercise as tolerated.       Relevant Orders   Hemoglobin A1c (Completed)   Travel advice encounter    Given an rx for Scopalamine patch for an upcoming boat trip       Other Visit Diagnoses    Need for Tdap vaccination    -  Primary    Relevant Orders    Tdap vaccine greater than or equal to 7yo IM (Completed)       I have discontinued Mr. Corella zoster vaccine live (PF). I am also having him maintain his loratadine and ibuprofen.  No orders of the defined types were placed in this encounter.     Penni Homans, MD

## 2015-03-16 NOTE — Assessment & Plan Note (Signed)
hgba1c acceptable, minimize simple carbs. Increase exercise as tolerated.  

## 2015-03-16 NOTE — Assessment & Plan Note (Signed)
Given an rx for Scopalamine patch for an upcoming boat trip

## 2015-06-30 ENCOUNTER — Ambulatory Visit: Payer: BLUE CROSS/BLUE SHIELD | Admitting: Family Medicine

## 2015-07-07 ENCOUNTER — Ambulatory Visit: Payer: BLUE CROSS/BLUE SHIELD | Admitting: Family Medicine

## 2015-07-21 ENCOUNTER — Ambulatory Visit: Payer: BLUE CROSS/BLUE SHIELD | Admitting: Family Medicine

## 2015-07-28 ENCOUNTER — Ambulatory Visit: Payer: BLUE CROSS/BLUE SHIELD | Admitting: Family Medicine

## 2015-08-12 DIAGNOSIS — Z808 Family history of malignant neoplasm of other organs or systems: Secondary | ICD-10-CM | POA: Diagnosis not present

## 2015-08-12 DIAGNOSIS — D1801 Hemangioma of skin and subcutaneous tissue: Secondary | ICD-10-CM | POA: Diagnosis not present

## 2015-08-12 DIAGNOSIS — L814 Other melanin hyperpigmentation: Secondary | ICD-10-CM | POA: Diagnosis not present

## 2015-08-12 DIAGNOSIS — L821 Other seborrheic keratosis: Secondary | ICD-10-CM | POA: Diagnosis not present

## 2015-08-12 DIAGNOSIS — L820 Inflamed seborrheic keratosis: Secondary | ICD-10-CM | POA: Diagnosis not present

## 2015-08-12 DIAGNOSIS — L570 Actinic keratosis: Secondary | ICD-10-CM | POA: Diagnosis not present

## 2015-09-22 ENCOUNTER — Encounter: Payer: BLUE CROSS/BLUE SHIELD | Admitting: Family Medicine

## 2015-12-19 DIAGNOSIS — H524 Presbyopia: Secondary | ICD-10-CM | POA: Diagnosis not present

## 2015-12-29 ENCOUNTER — Ambulatory Visit (INDEPENDENT_AMBULATORY_CARE_PROVIDER_SITE_OTHER): Payer: BLUE CROSS/BLUE SHIELD | Admitting: Family Medicine

## 2015-12-29 ENCOUNTER — Encounter: Payer: Self-pay | Admitting: Family Medicine

## 2015-12-29 VITALS — BP 130/86 | HR 95 | Temp 97.8°F | Ht 75.0 in | Wt 220.0 lb

## 2015-12-29 DIAGNOSIS — Z0000 Encounter for general adult medical examination without abnormal findings: Secondary | ICD-10-CM | POA: Diagnosis not present

## 2015-12-29 DIAGNOSIS — F988 Other specified behavioral and emotional disorders with onset usually occurring in childhood and adolescence: Secondary | ICD-10-CM | POA: Diagnosis not present

## 2015-12-29 DIAGNOSIS — R739 Hyperglycemia, unspecified: Secondary | ICD-10-CM

## 2015-12-29 DIAGNOSIS — R413 Other amnesia: Secondary | ICD-10-CM

## 2015-12-29 MED ORDER — METHYLPHENIDATE HCL 5 MG PO TABS: 5.0000 mg | ORAL_TABLET | Freq: Two times a day (BID) | ORAL | 0 refills | Status: DC

## 2015-12-29 NOTE — Progress Notes (Signed)
Subjective:    Patient ID: Austin Macdonald, male    DOB: December 15, 1955, 60 y.o.   MRN: [id number]  Chief Complaint  Patient presents with  . follow up general checkup    HPI Patient is in today for follow up, blood pressure and attention deficit.  No acute concerns. He feels well today and denies any acute illness or recent hospitalizations. Notes his recent neuropsychology testing ruled out any early cognitive impairment but did confirm ADD, he is interested in trying medications to manage his trouble with inattention. Denies CP/palp/SOB/HA/congestion/fevers/GI or GU c/o. Taking meds as prescribed  Past Medical History:  Diagnosis Date  . ADD (attention deficit disorder) 12/30/2015  . Benign paroxysmal positional vertigo 12/26/2014  . Chicken pox as a child  . Colon polyps   . Elevated BP 03/03/2015  . Hay fever    seasonal, spring, fall  . Hyperglycemia 03/03/2015  . Measles as a child  . Mumps as a child  . Preventative health care 07/25/2012  . Tinnitus 12/29/2014    Past Surgical History:  Procedure Laterality Date  . knee arthroscopic repair  2004   right    Family History  Problem Relation Age of Onset  . Hypertension Mother   . Other Paternal Grandfather     black lung  . Thyroid cancer Daughter   . Dementia Father     Social History   Social History  . Marital status: Married    Spouse name: N/A  . Number of children: N/A  . Years of education: N/A   Occupational History  . Not on file.   Social History Main Topics  . Smoking status: Never Smoker  . Smokeless tobacco: Never Used  . Alcohol use 0.0 oz/week     Comment: 10-12 drinks weekly  . Drug use: No  . Sexual activity: Yes     Comment: lives with wife, travels for work [place], no major dietary restrictions   Other Topics Concern  . Not on file   Social History Narrative  . No narrative on file    Outpatient Medications Prior to Visit  Medication Sig Dispense Refill  . ibuprofen  (ADVIL) 200 MG tablet Take 1 tablet (200 mg total) by mouth every 6 (six) hours as needed for pain. 30 tablet 0  . loratadine (CLARITIN) 10 MG tablet Take 10 mg by mouth daily.    Marland Kitchen scopolamine (TRANSDERM-SCOP) 1 MG/3DAYS Place 1 patch (1.5 mg total) onto the skin every 3 (three) days. 2 patch 0   No facility-administered medications prior to visit.     No Known Allergies  Review of Systems  Constitutional: Negative for fever.  Eyes: Negative for blurred vision.  Respiratory: Negative for cough and shortness of breath.   Cardiovascular: Negative for chest pain and palpitations.  Gastrointestinal: Negative for vomiting.  Musculoskeletal: Negative for back pain.  Skin: Negative for rash.  Neurological: Negative for loss of consciousness and headaches.       Objective:    Physical Exam  Constitutional: He appears well-developed and well-nourished. No distress.  HENT:  Head: Normocephalic and atraumatic.  Eyes: Conjunctivae are normal.  Neck: Normal range of motion. No thyromegaly present.  Cardiovascular: Normal rate and regular rhythm.   Pulmonary/Chest: Effort normal. He has no wheezes.  Abdominal: Soft. Bowel sounds are normal. There is no tenderness.  Musculoskeletal: Normal range of motion. He exhibits no edema or deformity.  Neurological: He is alert.  Skin: Skin is warm and dry. He is  not diaphoretic.  Psychiatric: He has a normal mood and affect.    BP 130/86 (BP Location: Left Arm, Cuff Size: Normal)   Pulse 95   Temp 97.8 F (36.6 C) (Oral)   Ht _0  (1.905 m)   Wt 220 lb (99.8 kg) Comment: wiht shoes  BMI 27.50 kg/m  Wt Readings from Last 3 Encounters:  12/29/15 220 lb (99.8 kg)  03/03/15 218 lb 3.2 oz (99 kg)  12/26/14 217 lb (98.4 kg)     Lab Results  Component Value Date   HGBA1C 5.9 03/03/2015    No results found for: TSH No results found for: WBC, HGB, HCT, MCV, PLT No results found for: NA, K, CHLORIDE, CO2, GLUCOSE, BUN, CREATININE, BILITOT,  ALKPHOS, AST, ALT, PROT, ALBUMIN, CALCIUM, ANIONGAP, EGFR, GFR No results found for: CHOL No results found for: HDL No results found for: LDLCALC No results found for: TRIG No results found for: CHOLHDL Lab Results  Component Value Date   HGBA1C 5.9 03/03/2015       Assessment & Plan:   Problem List Items Addressed This Visit    Preventative health care    Patient encouraged to maintain heart healthy diet, regular exercise, adequate sleep. Consider daily probiotics. Take medications as prescribed. Given and reviewed copy of ACP documents from [place] of State and encouraged to complete and return      Memory difficulties    Reassured by his neuropsychology testing that he is not struggling with cognitive impairment at this time      Hyperglycemia    hgba1c acceptable, minimize simple carbs. Increase exercise as tolerated.       ADD (attention deficit disorder)    Confirmed in neuropsychology testing done in [facility] and scanned into EMR. Start on Ritalin 5 mg po bid and reassess in one month.         I have discontinued [name] scopolamine. I am also having him start on methylphenidate. Additionally, I am having him maintain his loratadine and ibuprofen.  Meds ordered this encounter  Medications  . methylphenidate (RITALIN) 5 MG tablet    Sig: Take 1 tablet (5 mg total) by mouth 2 (two) times daily with breakfast and lunch.    Dispense:  60 tablet    Refill:  0   CMA served as scribe in visit. History, physical and plan performed by me. Documentation and orders reviewed by me and attested to.   Penni Homans, MD

## 2015-12-29 NOTE — Progress Notes (Signed)
Pre visit review using our clinic review tool, if applicable. No additional management support is needed unless otherwise documented below in the visit note. 

## 2015-12-29 NOTE — Patient Instructions (Signed)
Carbohydrate Counting for Diabetes Mellitus, Adult Carbohydrate counting is a method for keeping track of how many carbohydrates you eat. Eating carbohydrates naturally increases the amount of sugar (glucose) in the blood. Counting how many carbohydrates you eat helps keep your blood glucose within normal limits, which helps you manage your diabetes (diabetes mellitus). It is important to know how many carbohydrates you can safely have in each meal. This is different for every person. A diet and nutrition specialist (registered dietitian) can help you make a meal plan and calculate how many carbohydrates you should have at each meal and snack. Carbohydrates are found in the following foods:  Grains, such as breads and cereals.  Dried beans and soy products.  Starchy vegetables, such as potatoes, peas, and corn.  Fruit and fruit juices.  Milk and yogurt.  Sweets and snack foods, such as cake, cookies, candy, chips, and soft drinks. How do I count carbohydrates? There are two ways to count carbohydrates in food. You can use either of the methods or a combination of both. Reading "Nutrition Facts" on packaged food  The "Nutrition Facts" list is included on the labels of almost all packaged foods and beverages in the [place] It includes:  The serving size.  Information about nutrients in each serving, including the grams (g) of carbohydrate per serving. To use the "Nutrition Facts":  Decide how many servings you will have.  Multiply the number of servings by the number of carbohydrates per serving.  The resulting number is the total amount of carbohydrates that you will be having. Learning standard serving sizes of other foods  When you eat foods containing carbohydrates that are not packaged or do not include "Nutrition Facts" on the label, you need to measure the servings in order to count the amount of carbohydrates:  Measure the foods that you will eat with a food scale or measuring  cup, if needed.  Decide how many standard-size servings you will eat.  Multiply the number of servings by 15. Most carbohydrate-rich foods have about 15 g of carbohydrates per serving.  For example, if you eat 8 oz (170 g) of strawberries, you will have eaten 2 servings and 30 g of carbohydrates (2 servings x 15 g = 30 g).  For foods that have more than one food mixed, such as soups and casseroles, you must count the carbohydrates in each food that is included. The following list contains standard serving sizes of common carbohydrate-rich foods. Each of these servings has about 15 g of carbohydrates:   hamburger bun or  English muffin.   oz (15 mL) syrup.   oz (14 g) jelly.  1 slice of bread.  1 six-inch tortilla.  3 oz (85 g) cooked rice or pasta.  4 oz (113 g) cooked dried beans.  4 oz (113 g) starchy vegetable, such as peas, corn, or potatoes.  4 oz (113 g) hot cereal.  4 oz (113 g) mashed potatoes or  of a large baked potato.  4 oz (113 g) canned or frozen fruit.  4 oz (120 mL) fruit juice.  4-6 crackers.  6 chicken nuggets.  6 oz (170 g) unsweetened dry cereal.  6 oz (170 g) plain fat-free yogurt or yogurt sweetened with artificial sweeteners.  8 oz (240 mL) milk.  8 oz (170 g) fresh fruit or one small piece of fruit.  24 oz (680 g) popped popcorn. Example of carbohydrate counting Sample meal  3 oz (85 g) chicken breast.  6 oz (  170 g) brown rice.  4 oz (113 g) corn.  8 oz (240 mL) milk.  8 oz (170 g) strawberries with sugar-free whipped topping. Carbohydrate calculation 1. Identify the foods that contain carbohydrates:  Rice.  Corn.  Milk.  Strawberries. 2. Calculate how many servings you have of each food:  2 servings rice.  1 serving corn.  1 serving milk.  1 serving strawberries. 3. Multiply each number of servings by 15 g:  2 servings rice x 15 g = 30 g.  1 serving corn x 15 g = 15 g.  1 serving milk x 15 g = 15  g.  1 serving strawberries x 15 g = 15 g. 4. Add together all of the amounts to find the total grams of carbohydrates eaten:  30 g + 15 g + 15 g + 15 g = 75 g of carbohydrates total. This information is not intended to replace advice given to you by your health care provider. Make sure you discuss any questions you have with your health care provider. Document Released: 12/28/2004 Document Revised: 07/18/2015 Document Reviewed: 06/11/2015 Elsevier Interactive Patient Education  2017 Elsevier Inc.  

## 2015-12-30 ENCOUNTER — Encounter: Payer: Self-pay | Admitting: Family Medicine

## 2015-12-30 ENCOUNTER — Telehealth: Payer: Self-pay

## 2015-12-30 DIAGNOSIS — F988 Other specified behavioral and emotional disorders with onset usually occurring in childhood and adolescence: Secondary | ICD-10-CM

## 2015-12-30 HISTORY — DX: Other specified behavioral and emotional disorders with onset usually occurring in childhood and adolescence: F98.8

## 2015-12-30 NOTE — Assessment & Plan Note (Signed)
Confirmed in neuropsychology testing done in [place] and scanned into EMR. Start on Ritalin 5 mg po bid and reassess in one month.

## 2015-12-30 NOTE — Telephone Encounter (Signed)
Received PA approval notification letter. Letter sent for scanning.  

## 2015-12-30 NOTE — Telephone Encounter (Signed)
PA initiated via Covermymeds; KEY: [id number]. Received real-time response. PA approved.

## 2015-12-30 NOTE — Assessment & Plan Note (Signed)
hgba1c acceptable, minimize simple carbs. Increase exercise as tolerated.  

## 2015-12-30 NOTE — Assessment & Plan Note (Signed)
Patient encouraged to maintain heart healthy diet, regular exercise, adequate sleep. Consider daily probiotics. Take medications as prescribed. Given and reviewed copy of ACP documents from [place] Secretary of State and encouraged to complete and return 

## 2015-12-30 NOTE — Assessment & Plan Note (Signed)
Reassured by his neuropsychology testing that he is not struggling with cognitive impairment at this time

## 2015-12-30 NOTE — Telephone Encounter (Signed)
Received approval letter Medication approved from 11/30/2015 through 12/29/2018. Sent approval letter to scan.

## 2016-02-05 ENCOUNTER — Other Ambulatory Visit: Payer: Self-pay | Admitting: Family Medicine

## 2016-02-10 ENCOUNTER — Telehealth: Payer: Self-pay | Admitting: Family Medicine

## 2016-02-10 ENCOUNTER — Ambulatory Visit (INDEPENDENT_AMBULATORY_CARE_PROVIDER_SITE_OTHER): Payer: BLUE CROSS/BLUE SHIELD | Admitting: Family Medicine

## 2016-02-10 ENCOUNTER — Other Ambulatory Visit: Payer: Self-pay

## 2016-02-10 ENCOUNTER — Ambulatory Visit: Payer: Self-pay | Admitting: Family Medicine

## 2016-02-10 DIAGNOSIS — F988 Other specified behavioral and emotional disorders with onset usually occurring in childhood and adolescence: Secondary | ICD-10-CM

## 2016-02-10 MED ORDER — METHYLPHENIDATE HCL 5 MG PO TABS: 5.0000 mg | ORAL_TABLET | Freq: Two times a day (BID) | ORAL | 0 refills | Status: DC

## 2016-02-10 NOTE — Progress Notes (Signed)
RN blood pressure check note reviewed. Agree with documention and plan. 

## 2016-02-10 NOTE — Telephone Encounter (Signed)
Relation to [id number] Call back number:[phone] Pharmacy: [id number] - [place], [place] [phone] ([phone] (Fax)     Reason for call:  Patient states pharmacy informed that methylphenidate (RITALIN) 5 MG tablet has to be e scribed or hard copy, please advise best # [phone]

## 2016-02-10 NOTE — Patient Instructions (Signed)
Per Dr. Charlett Blake patient can have 1 refill on Ritalin and return for refill in 3 weeks. Appointment scheduled.  Rx for Ritalin faxed to pharmacy.

## 2016-02-10 NOTE — Progress Notes (Signed)
Pre visit review using our clinic tool,if applicable. No additional management support is needed unless otherwise documented below in the visit note.   Patient in for BP check per Dr, Charlett Blake. Patient started Ritalin 5 mg on last OV.  Has not taken today.  BP today =  141/82 P= 63 Per Dr. Charlett Blake, patient to start Toprolol  Xl 25mg  patient refused. Per Dr. Charlett Blake patient can have 1 refill on Ritalin and return for refill in 3 weeks. Appointment scheduled.  Rx for Ritalin faxed to pharmacy.

## 2016-02-10 NOTE — Telephone Encounter (Signed)
Called the patient to clarify prescription. Evidently prescription was faxed and pharmacy did delete it. Ritalin is not suppose to be faxed--must be picked up as hardcopy and hand delivered by the patient to the pharmacist. PCP is aware, found original prescription from RN's desk and will put at the front desk for Patient to pickup.

## 2016-03-05 ENCOUNTER — Ambulatory Visit (INDEPENDENT_AMBULATORY_CARE_PROVIDER_SITE_OTHER): Payer: BLUE CROSS/BLUE SHIELD

## 2016-03-05 ENCOUNTER — Other Ambulatory Visit: Payer: Self-pay

## 2016-03-05 VITALS — BP 155/96 | HR 64

## 2016-03-05 DIAGNOSIS — I10 Essential (primary) hypertension: Secondary | ICD-10-CM

## 2016-03-05 MED ORDER — METOPROLOL SUCCINATE ER 25 MG PO TB24: 25.0000 mg | ORAL_TABLET | Freq: Every day | ORAL | 3 refills | Status: DC

## 2016-03-05 MED ORDER — METHYLPHENIDATE HCL 5 MG PO TABS: 5.0000 mg | ORAL_TABLET | Freq: Two times a day (BID) | ORAL | 0 refills | Status: DC

## 2016-03-05 NOTE — Progress Notes (Signed)
RN blood pressure check note reviewed. Agree with documention and plan. 

## 2016-03-05 NOTE — Progress Notes (Signed)
Pre visit review using our clinic tool,if applicable. No additional management support is needed unless otherwise documented below in the visit note.   Advised patient to take Ritalin 1 tablet with breakfast and 1 tablet with lunch and Metoprolol 1 tablet daily and to try and keep stress levels at a minimum to help with elevations in BP. Patient agreed.

## 2016-03-05 NOTE — Telephone Encounter (Signed)
Called pharmacy to make changes to initial order of Metoprolol XL to #30 with 1 RF.

## 2016-03-05 NOTE — Progress Notes (Signed)
Pre visit review using our clinic tool,if applicable. No additional management support is needed unless otherwise documented below in the visit note.   Patient in for BP check. Currently taking Ritalin 5 mg 2 tablets daily. Patient states he has been taking 2 tablets in the am. States he misunderstood directions. Per Dr. Charlett Blake ordered 2 tablet 1 with breakfast and 1 with lunch per order.  Patient states he currently has elevated stress level due to work.   Per Dr. Charlett Blake start Metoprolol 25 mg daily along with Ritalin 5 mg. Return to office for BP check in 2-3 weeks. Patient agrees but would like to schedule consultation with Dr. Charlett Blake regarding medications. Appointment scheduled.

## 2016-05-03 ENCOUNTER — Ambulatory Visit (INDEPENDENT_AMBULATORY_CARE_PROVIDER_SITE_OTHER): Payer: BLUE CROSS/BLUE SHIELD | Admitting: Family Medicine

## 2016-05-03 ENCOUNTER — Encounter: Payer: Self-pay | Admitting: Family Medicine

## 2016-05-03 DIAGNOSIS — R739 Hyperglycemia, unspecified: Secondary | ICD-10-CM | POA: Diagnosis not present

## 2016-05-03 DIAGNOSIS — I10 Essential (primary) hypertension: Secondary | ICD-10-CM

## 2016-05-03 DIAGNOSIS — F988 Other specified behavioral and emotional disorders with onset usually occurring in childhood and adolescence: Secondary | ICD-10-CM

## 2016-05-03 DIAGNOSIS — E782 Mixed hyperlipidemia: Secondary | ICD-10-CM | POA: Diagnosis not present

## 2016-05-03 MED ORDER — METHYLPHENIDATE HCL 5 MG PO TABS: 5.0000 mg | ORAL_TABLET | Freq: Two times a day (BID) | ORAL | 0 refills | Status: DC

## 2016-05-03 NOTE — Assessment & Plan Note (Signed)
He has chosen not to take Ritalin for the past month. He has done OK for the most part but he does struggle at work. He does agree to take just 5 mg in am and to check his BP 2-4 hours after taking the med.

## 2016-05-03 NOTE — Progress Notes (Signed)
Pre visit review using our clinic review tool, if applicable. No additional management support is needed unless otherwise documented below in the visit note. 

## 2016-05-03 NOTE — Patient Instructions (Signed)

## 2016-05-03 NOTE — Assessment & Plan Note (Signed)
hgba1c acceptable, minimize simple carbs. Increase exercise as tolerated.  

## 2016-05-03 NOTE — Assessment & Plan Note (Addendum)
Well controlled here but he reports at home the blood pressure 158/82 can go down to 130/80 after 15 minutes of work., no changes to meds. Encouraged heart healthy diet such as the DASH diet and exercise as tolerated. He has made a commitment to diet and exercise and would like to try to go without meds for now. Will reassess in 3 months and he will notify us if he has any concerns.

## 2016-05-03 NOTE — Progress Notes (Signed)
Subjective:  I acted as a Education administrator for Dr. Charlett Blake. [place], [place]   Patient ID: Austin Macdonald, male    DOB: 03-Feb-1955, 61 y.o.   MRN: [id number]  Chief Complaint  Patient presents with  . Follow-up  . Hypertension    HPI  Patient is in today for a hypertension follow up. Patient states he would like to discontinue medications and try diet and exercise. He has been seeing blood pressures in the 150s when he takes it at home but then after resting it returns to 130s. He feels well. Has not taken Metoprolol and Ritalin for last month. No recent febrile illness or hospitalization. Denies CP/palp/SOB/HA/congestion/fevers/GI or GU c/o. Taking meds as prescribed  Patient Care Team: Mosie Lukes, MD as PCP - General (Family Medicine)   Past Medical History:  Diagnosis Date  . ADD (attention deficit disorder) 12/30/2015  . Benign paroxysmal positional vertigo 12/26/2014  . Chicken pox as a child  . Colon polyps   . Elevated BP 03/03/2015  . Hay fever    seasonal, spring, fall  . Hyperglycemia 03/03/2015  . Measles as a child  . Mumps as a child  . Preventative health care 07/25/2012  . Tinnitus 12/29/2014    Past Surgical History:  Procedure Laterality Date  . knee arthroscopic repair  2004   right    Family History  Problem Relation Age of Onset  . Hypertension Mother   . Other Paternal Grandfather     black lung  . Thyroid cancer Daughter   . Dementia Father     Social History   Social History  . Marital status: Married    Spouse name: N/A  . Number of children: N/A  . Years of education: N/A   Occupational History  . Not on file.   Social History Main Topics  . Smoking status: Never Smoker  . Smokeless tobacco: Never Used  . Alcohol use 0.0 oz/week     Comment: 10-12 drinks weekly  . Drug use: No  . Sexual activity: Yes     Comment: lives with wife, travels for work [place], no major dietary restrictions   Other Topics Concern  . Not on file    Social History Narrative  . No narrative on file    Outpatient Medications Prior to Visit  Medication Sig Dispense Refill  . ibuprofen (ADVIL) 200 MG tablet Take 1 tablet (200 mg total) by mouth every 6 (six) hours as needed for pain. 30 tablet 0  . loratadine (CLARITIN) 10 MG tablet Take 10 mg by mouth daily.    . methylphenidate (RITALIN) 5 MG tablet Take 1 tablet (5 mg total) by mouth 2 (two) times daily with breakfast and lunch. (Patient not taking: Reported on 05/03/2016) 60 tablet 0  . metoprolol succinate (TOPROL-XL) 25 MG 24 hr tablet Take 1 tablet (25 mg total) by mouth daily. (Patient not taking: Reported on 05/03/2016) 90 tablet 3   No facility-administered medications prior to visit.     No Known Allergies  Review of Systems  Constitutional: Negative for fever and malaise/fatigue.  HENT: Negative for congestion.   Eyes: Negative for blurred vision.  Respiratory: Negative for cough and shortness of breath.   Cardiovascular: Negative for chest pain, palpitations and leg swelling.  Gastrointestinal: Negative for vomiting.  Musculoskeletal: Negative for back pain.  Skin: Negative for rash.  Neurological: Negative for loss of consciousness and headaches.       Objective:    Physical Exam  Constitutional: He is oriented to person, place, and time. He appears well-developed and well-nourished. No distress.  HENT:  Head: Normocephalic and atraumatic.  Eyes: Conjunctivae are normal.  Neck: Normal range of motion. No thyromegaly present.  Cardiovascular: Normal rate and regular rhythm.   Pulmonary/Chest: Effort normal and breath sounds normal. He has no wheezes.  Abdominal: Soft. Bowel sounds are normal. There is no tenderness.  Musculoskeletal: Normal range of motion. He exhibits no edema or deformity.  Neurological: He is alert and oriented to person, place, and time.  Skin: Skin is warm and dry. He is not diaphoretic.  Psychiatric: He has a normal mood and affect.     BP 130/84 (BP Location: Left Arm, Patient Position: Sitting, Cuff Size: Large)   Pulse (!) 58   Temp 98.2 F (36.8 C) (Oral)   Resp 18   Wt 216 lb 12.8 oz (98.3 kg)   SpO2 98%   BMI 27.10 kg/m  Wt Readings from Last 3 Encounters:  05/03/16 216 lb 12.8 oz (98.3 kg)  12/29/15 220 lb (99.8 kg)  03/03/15 218 lb 3.2 oz (99 kg)   BP Readings from Last 3 Encounters:  05/03/16 130/84  03/05/16 (!) 155/96  12/29/15 130/86     Immunization History  Administered Date(s) Administered  . Influenza Split 10/12/2011, 09/19/2014  . Influenza-Unspecified 09/26/2015  . Td 01/12/1999  . Tdap 03/03/2015  . Zoster 12/26/2014    Health Maintenance  Topic Date Due  . HIV Screening  10/21/1968  . INFLUENZA VACCINE  08/11/2016  . COLONOSCOPY  01/01/2024  . TETANUS/TDAP  03/02/2025  . Hepatitis C Screening  Completed    Lab Results  Component Value Date   HGBA1C 5.9 03/03/2015    No results found for: TSH No results found for: WBC, HGB, HCT, MCV, PLT No results found for: NA, K, CHLORIDE, CO2, GLUCOSE, BUN, CREATININE, BILITOT, ALKPHOS, AST, ALT, PROT, ALBUMIN, CALCIUM, ANIONGAP, EGFR, GFR No results found for: CHOL No results found for: HDL No results found for: LDLCALC No results found for: TRIG No results found for: CHOLHDL Lab Results  Component Value Date   HGBA1C 5.9 03/03/2015         Assessment & Plan:   Problem List Items Addressed This Visit    Hyperglycemia    hgba1c acceptable, minimize simple carbs. Increase exercise as tolerated.       High blood pressure    Well controlled here but he reports at home the blood pressure 158/82 can go down to 130/80 after 15 minutes of work., no changes to meds. Encouraged heart healthy diet such as the DASH diet and exercise as tolerated. He has made a commitment to diet and exercise and would like to try to go without meds for now. Will reassess in 3 months and he will notify us if he has any concerns.       ADD  (attention deficit disorder)    He has chosen not to take Ritalin for the past month. He has done OK for the most part but he does struggle at work. He does agree to take just 5 mg in am and to check his BP 2-4 hours after taking the med.       Hyperlipidemia, mixed      I have discontinued Mr. Safranek metoprolol succinate. I am also having him maintain his loratadine, ibuprofen, and methylphenidate.  Meds ordered this encounter  Medications  . methylphenidate (RITALIN) 5 MG tablet    Sig: Take 1  tablet (5 mg total) by mouth 2 (two) times daily with breakfast and lunch.    Dispense:  30 tablet    Refill:  0    CMA served as scribe during this visit. History, Physical and Plan performed by medical provider. Documentation and orders reviewed and attested to.  Penni Homans, MD

## 2016-08-02 ENCOUNTER — Ambulatory Visit: Payer: BLUE CROSS/BLUE SHIELD | Admitting: Family Medicine

## 2016-08-16 ENCOUNTER — Ambulatory Visit: Payer: BLUE CROSS/BLUE SHIELD | Admitting: Family Medicine

## 2016-08-16 ENCOUNTER — Ambulatory Visit (INDEPENDENT_AMBULATORY_CARE_PROVIDER_SITE_OTHER): Payer: BLUE CROSS/BLUE SHIELD | Admitting: Family Medicine

## 2016-08-16 VITALS — BP 126/82 | HR 72 | Temp 98.2°F | Resp 18 | Wt 215.2 lb

## 2016-08-16 DIAGNOSIS — R739 Hyperglycemia, unspecified: Secondary | ICD-10-CM

## 2016-08-16 DIAGNOSIS — F988 Other specified behavioral and emotional disorders with onset usually occurring in childhood and adolescence: Secondary | ICD-10-CM

## 2016-08-16 DIAGNOSIS — E782 Mixed hyperlipidemia: Secondary | ICD-10-CM | POA: Diagnosis not present

## 2016-08-16 MED ORDER — METHYLPHENIDATE HCL 5 MG PO TABS: 5.0000 mg | ORAL_TABLET | Freq: Two times a day (BID) | ORAL | 0 refills | Status: DC

## 2016-08-16 MED ORDER — METHYLPHENIDATE HCL 5 MG PO TABS: 5.0000 mg | ORAL_TABLET | Freq: Every day | ORAL | 0 refills | Status: DC

## 2016-08-16 NOTE — Assessment & Plan Note (Signed)
Given 3 months supply of Ritalin to use prn

## 2016-08-16 NOTE — Progress Notes (Signed)
Subjective:  I acted as a Education administrator for Dr. Charlett Blake. [place], [place]  Patient ID: GILMORE LIST, male    DOB: 1955-09-01, 61 y.o.   MRN: [id number]  No chief complaint on file.   HPI  Patient is in today for a 3 month medication check. Patient is currently on $RemoveBefo'5mg'[id number]$  of Ritalin. He states he is doing well on the dosage, his blood pressure reading at home are average. No recent febrile illness or acute hospitalizations. Denies CP/palp/SOB/HA/congestion/fevers/GI or GU c/o. Taking meds as prescribed. Brings in blood pressure log which shows all numbers systolic ranging from 086V to 135.  No new concerns.    Patient Care Team: Mosie Lukes, MD as PCP - General (Family Medicine)   Past Medical History:  Diagnosis Date  . ADD (attention deficit disorder) 12/30/2015  . Benign paroxysmal positional vertigo 12/26/2014  . Chicken pox as a child  . Colon polyps   . Elevated BP 03/03/2015  . Hay fever    seasonal, spring, fall  . Hyperglycemia 03/03/2015  . Measles as a child  . Mumps as a child  . Preventative health care 07/25/2012  . Tinnitus 12/29/2014    Past Surgical History:  Procedure Laterality Date  . knee arthroscopic repair  2004   right    Family History  Problem Relation Age of Onset  . Hypertension Mother   . Other Paternal Grandfather        black lung  . Thyroid cancer Daughter   . Dementia Father     Social History   Social History  . Marital status: Married    Spouse name: N/A  . Number of children: N/A  . Years of education: N/A   Occupational History  . Not on file.   Social History Main Topics  . Smoking status: Never Smoker  . Smokeless tobacco: Never Used  . Alcohol use 0.0 oz/week     Comment: 10-12 drinks weekly  . Drug use: No  . Sexual activity: Yes     Comment: lives with wife, travels for work [place], no major dietary restrictions   Other Topics Concern  . Not on file   Social History Narrative  . No narrative on file     Outpatient Medications Prior to Visit  Medication Sig Dispense Refill  . ibuprofen (ADVIL) 200 MG tablet Take 1 tablet (200 mg total) by mouth every 6 (six) hours as needed for pain. 30 tablet 0  . loratadine (CLARITIN) 10 MG tablet Take 10 mg by mouth daily.    . methylphenidate (RITALIN) 5 MG tablet Take 1 tablet (5 mg total) by mouth 2 (two) times daily with breakfast and lunch. 30 tablet 0   No facility-administered medications prior to visit.     No Known Allergies  Review of Systems  Constitutional: Negative for fever and malaise/fatigue.  HENT: Negative for congestion.   Eyes: Negative for blurred vision.  Respiratory: Negative for cough and shortness of breath.   Cardiovascular: Negative for chest pain, palpitations and leg swelling.  Gastrointestinal: Negative for vomiting.  Musculoskeletal: Negative for back pain.  Skin: Negative for rash.  Neurological: Negative for loss of consciousness and headaches.       Objective:    Physical Exam  Constitutional: He is oriented to person, place, and time. He appears well-developed and well-nourished. No distress.  HENT:  Head: Normocephalic and atraumatic.  Eyes: Conjunctivae are normal.  Neck: Normal range of motion. No thyromegaly present.  Cardiovascular: Normal rate  and regular rhythm.   Pulmonary/Chest: Effort normal and breath sounds normal. He has no wheezes.  Abdominal: Soft. Bowel sounds are normal. There is no tenderness.  Musculoskeletal: Normal range of motion. He exhibits no edema or deformity.  Neurological: He is alert and oriented to person, place, and time.  Skin: Skin is warm and dry. He is not diaphoretic.  Psychiatric: He has a normal mood and affect.    BP 126/82 (BP Location: Left Arm, Patient Position: Sitting, Cuff Size: Normal)   Pulse 72   Temp 98.2 F (36.8 C) (Oral)   Resp 18   Wt 215 lb 3.2 oz (97.6 kg)   SpO2 96%   BMI 26.90 kg/m  Wt Readings from Last 3 Encounters:  08/16/16 215  lb 3.2 oz (97.6 kg)  05/03/16 216 lb 12.8 oz (98.3 kg)  12/29/15 220 lb (99.8 kg)   BP Readings from Last 3 Encounters:  08/16/16 126/82  05/03/16 130/84  03/05/16 (!) 155/96     Immunization History  Administered Date(s) Administered  . Influenza Split 10/12/2011, 09/19/2014  . Influenza-Unspecified 09/26/2015  . Td 01/12/1999  . Tdap 03/03/2015  . Zoster 12/26/2014    Health Maintenance  Topic Date Due  . HIV Screening  10/21/1968  . INFLUENZA VACCINE  08/11/2016  . COLONOSCOPY  01/01/2024  . TETANUS/TDAP  03/02/2025  . Hepatitis C Screening  Completed    Lab Results  Component Value Date   HGBA1C 5.9 03/03/2015    No results found for: TSH No results found for: WBC, HGB, HCT, MCV, PLT No results found for: NA, K, CHLORIDE, CO2, GLUCOSE, BUN, CREATININE, BILITOT, ALKPHOS, AST, ALT, PROT, ALBUMIN, CALCIUM, ANIONGAP, EGFR, GFR No results found for: CHOL No results found for: HDL No results found for: LDLCALC No results found for: TRIG No results found for: CHOLHDL Lab Results  Component Value Date   HGBA1C 5.9 03/03/2015         Assessment & Plan:   Problem List Items Addressed This Visit    Hyperglycemia    hgba1c acceptable, minimize simple carbs. Increase exercise as tolerated.       ADD (attention deficit disorder)    Given 3 months supply of Ritalin to use prn      Hyperlipidemia, mixed - Primary      I have changed Mr. Mcqueary methylphenidate. I am also having him start on methylphenidate and methylphenidate. Additionally, I am having him maintain his loratadine and ibuprofen.  Meds ordered this encounter  Medications  . methylphenidate (RITALIN) 5 MG tablet    Sig: Take 1 tablet (5 mg total) by mouth 2 (two) times daily with breakfast and lunch. August 2018    Dispense:  30 tablet    Refill:  0  . methylphenidate (RITALIN) 5 MG tablet    Sig: Take 1 tablet (5 mg total) by mouth daily. September 2018    Dispense:  30 tablet    Refill:   0  . methylphenidate (RITALIN) 5 MG tablet    Sig: Take 1 tablet (5 mg total) by mouth daily. October 2018    Dispense:  30 tablet    Refill:  0    CMA served as Education administrator during this visit. History, Physical and Plan performed by medical provider. Documentation and orders reviewed and attested to.  Penni Homans, MD

## 2016-08-16 NOTE — Patient Instructions (Addendum)
CMP (complete metabolic profile) CBC (complete blood count)  shingrix is the new shingles shot, 2 shots over 6 months. Call insurance and confirm they cover then if you want it can call for an appt or wait for next visit      Cholesterol Cholesterol is a white, waxy, fat-like substance that is needed by the human body in small amounts. The liver makes all the cholesterol we need. Cholesterol is carried from the liver by the blood through the blood vessels. Deposits of cholesterol (plaques) may build up on blood vessel (artery) walls. Plaques make the arteries narrower and stiffer. Cholesterol plaques increase the risk for heart attack and stroke. You cannot feel your cholesterol level even if it is very high. The only way to know that it is high is to have a blood test. Once you know your cholesterol levels, you should keep a record of the test results. Work with your health care provider to keep your levels in the desired range. What do the results mean?  Total cholesterol is a rough measure of all the cholesterol in your blood.  LDL (low-density lipoprotein) is the "bad" cholesterol. This is the type that causes plaque to build up on the artery walls. You want this level to be low.  HDL (high-density lipoprotein) is the "good" cholesterol because it cleans the arteries and carries the LDL away. You want this level to be high.  Triglycerides are fat that the body can either burn for energy or store. High levels are closely linked to heart disease. What are the desired levels of cholesterol?  Total cholesterol below 200.  LDL below 100 for people who are at risk, below 70 for people at very high risk.  HDL above 40 is good. A level of 60 or higher is considered to be protective against heart disease.  Triglycerides below 150. How can I lower my cholesterol? Diet Follow your diet program as told by your health care provider.  Choose fish or white meat chicken and [place], roasted or  baked. Limit fatty cuts of red meat, fried foods, and processed meats, such as sausage and lunch meats.  Eat lots of fresh fruits and vegetables.  Choose whole grains, beans, pasta, potatoes, and cereals.  Choose olive oil, corn oil, or canola oil, and use only small amounts.  Avoid butter, mayonnaise, shortening, or palm kernel oils.  Avoid foods with trans fats.  Drink skim or nonfat milk and eat low-fat or nonfat yogurt and cheeses. Avoid whole milk, cream, ice cream, egg yolks, and full-fat cheeses.  Healthier desserts include angel food cake, ginger snaps, animal crackers, hard candy, popsicles, and low-fat or nonfat frozen yogurt. Avoid pastries, cakes, pies, and cookies.  Exercise  Follow your exercise program as told by your health care provider. A regular program: ? Helps to decrease LDL and raise HDL. ? Helps with weight control.  Do things that increase your activity level, such as gardening, walking, and taking the stairs.  Ask your health care provider about ways that you can be more active in your daily life.  Medicine  Take over-the-counter and prescription medicines only as told by your health care provider. ? Medicine may be prescribed by your health care provider to help lower cholesterol and decrease the risk for heart disease. This is usually done if diet and exercise have failed to bring down cholesterol levels. ? If you have several risk factors, you may need medicine even if your levels are normal.  This information  is not intended to replace advice given to you by your health care provider. Make sure you discuss any questions you have with your health care provider. Document Released: 09/22/2000 Document Revised: 07/26/2015 Document Reviewed: 06/28/2015 Elsevier Interactive Patient Education  2017 [place] count)

## 2016-08-16 NOTE — Assessment & Plan Note (Signed)
hgba1c acceptable, minimize simple carbs. Increase exercise as tolerated.  

## 2016-11-26 ENCOUNTER — Other Ambulatory Visit: Payer: Self-pay | Admitting: Family Medicine

## 2016-11-26 NOTE — Telephone Encounter (Signed)
Relation to pt: self  Call back number:[phone]   Reason for call:  Patient requesting 3 month supply methylphenidate (RITALIN) 5 MG tablet, patient advise

## 2016-11-29 DIAGNOSIS — Z808 Family history of malignant neoplasm of other organs or systems: Secondary | ICD-10-CM | POA: Diagnosis not present

## 2016-11-29 DIAGNOSIS — L821 Other seborrheic keratosis: Secondary | ICD-10-CM | POA: Diagnosis not present

## 2016-11-29 DIAGNOSIS — L570 Actinic keratosis: Secondary | ICD-10-CM | POA: Diagnosis not present

## 2016-11-29 DIAGNOSIS — D1801 Hemangioma of skin and subcutaneous tissue: Secondary | ICD-10-CM | POA: Diagnosis not present

## 2016-11-29 DIAGNOSIS — L814 Other melanin hyperpigmentation: Secondary | ICD-10-CM | POA: Diagnosis not present

## 2016-11-29 MED ORDER — METHYLPHENIDATE HCL 5 MG PO TABS: 5.0000 mg | ORAL_TABLET | Freq: Every day | ORAL | 0 refills | Status: DC

## 2016-11-29 MED ORDER — METHYLPHENIDATE HCL 5 MG PO TABS
5.0000 mg | ORAL_TABLET | Freq: Two times a day (BID) | ORAL | 0 refills | Status: DC
Start: 2016-11-29 — End: 2016-11-30

## 2016-11-29 NOTE — Telephone Encounter (Signed)
Requesting:Ritalin Contract:no UDS:no/ Maryjane Hurter OV:08/16/16 Next OV:02/21/17 Last Refill:08/16/16 Aug, Sept, Oct  #[id number]  Please advise

## 2016-11-29 NOTE — Telephone Encounter (Signed)
I have printed but he needs a contract and a UDS

## 2016-11-30 MED ORDER — METHYLPHENIDATE HCL 5 MG PO TABS: 5.0000 mg | ORAL_TABLET | Freq: Two times a day (BID) | ORAL | 0 refills | Status: DC

## 2016-11-30 MED ORDER — METHYLPHENIDATE HCL 5 MG PO TABS: 5.0000 mg | ORAL_TABLET | Freq: Every day | ORAL | 0 refills | Status: DC

## 2016-11-30 NOTE — Telephone Encounter (Signed)
Called patient left message for him to pick medication up

## 2016-11-30 NOTE — Addendum Note (Signed)
Addended by: Magdalene Molly A on: 11/30/2016 03:43 PM   Modules accepted: Orders

## 2016-12-22 DIAGNOSIS — H524 Presbyopia: Secondary | ICD-10-CM | POA: Diagnosis not present

## 2017-02-21 ENCOUNTER — Ambulatory Visit (INDEPENDENT_AMBULATORY_CARE_PROVIDER_SITE_OTHER): Payer: BLUE CROSS/BLUE SHIELD | Admitting: Family Medicine

## 2017-02-21 ENCOUNTER — Encounter: Payer: Self-pay | Admitting: Family Medicine

## 2017-02-21 ENCOUNTER — Other Ambulatory Visit: Payer: BLUE CROSS/BLUE SHIELD

## 2017-02-21 VITALS — BP 140/82 | HR 58 | Temp 97.4°F | Resp 18 | Ht 75.0 in | Wt 212.6 lb

## 2017-02-21 DIAGNOSIS — F988 Other specified behavioral and emotional disorders with onset usually occurring in childhood and adolescence: Secondary | ICD-10-CM

## 2017-02-21 DIAGNOSIS — Z79899 Other long term (current) drug therapy: Secondary | ICD-10-CM | POA: Diagnosis not present

## 2017-02-21 DIAGNOSIS — Z0000 Encounter for general adult medical examination without abnormal findings: Secondary | ICD-10-CM

## 2017-02-21 DIAGNOSIS — E782 Mixed hyperlipidemia: Secondary | ICD-10-CM | POA: Diagnosis not present

## 2017-02-21 DIAGNOSIS — R739 Hyperglycemia, unspecified: Secondary | ICD-10-CM | POA: Diagnosis not present

## 2017-02-21 LAB — LIPID PANEL
CHOL/HDL RATIO: 4
CHOLESTEROL: 183 mg/dL (ref 0–200)
HDL: 52.2 mg/dL (ref >39.00)
LDL CALC: 98 mg/dL (ref 0–99)
NonHDL: 131.25
Triglycerides: 168 mg/dL — ABNORMAL HIGH (ref 0.0–149.0)
VLDL: 33.6 mg/dL (ref 0.0–40.0)

## 2017-02-21 LAB — CBC
HEMATOCRIT: 41.2 % (ref 38.5–50.0)
HEMOGLOBIN: 13.7 g/dL (ref 13.2–17.1)
MCH: 30.1 pg (ref 27.0–33.0)
MCHC: 33.3 g/dL (ref 32.0–36.0)
MCV: 90.5 fL (ref 80.0–100.0)
MPV: 10.4 fL (ref 7.5–12.5)
Platelets: 263 10*3/uL (ref 140–400)
RBC: 4.55 10*6/uL (ref 4.20–5.80)
RDW: 12.2 % (ref 11.0–15.0)
WBC: 4.8 10*3/uL (ref 3.8–10.8)

## 2017-02-21 LAB — COMPREHENSIVE METABOLIC PANEL
ALBUMIN: 4.1 g/dL (ref 3.5–5.2)
ALT: 9 U/L (ref 0–53)
AST: 15 U/L (ref 0–37)
Alkaline Phosphatase: 43 U/L (ref 39–117)
BUN: 12 mg/dL (ref 6–23)
CALCIUM: 8.8 mg/dL (ref 8.4–10.5)
CHLORIDE: 105 meq/L (ref 96–112)
CO2: 29 meq/L (ref 19–32)
CREATININE: 0.77 mg/dL (ref 0.40–1.50)
GFR: 108.33 mL/min (ref >60.00)
Glucose, Bld: 99 mg/dL (ref 70–99)
POTASSIUM: 4.4 meq/L (ref 3.5–5.1)
Sodium: 142 mEq/L (ref 135–145)
Total Bilirubin: 0.8 mg/dL (ref 0.2–1.2)
Total Protein: 6.4 g/dL (ref 6.0–8.3)

## 2017-02-21 LAB — TSH: TSH: 2.5 u[IU]/mL (ref 0.35–4.50)

## 2017-02-21 LAB — HEMOGLOBIN A1C: Hgb A1c MFr Bld: 5.8 % (ref 4.6–6.5)

## 2017-02-21 LAB — PSA: PSA: 0.82 ng/mL (ref 0.10–4.00)

## 2017-02-21 MED ORDER — METHYLPHENIDATE HCL 5 MG PO TABS: 5.0000 mg | ORAL_TABLET | Freq: Every day | ORAL | 0 refills | Status: DC

## 2017-02-21 MED ORDER — METHYLPHENIDATE HCL 5 MG PO TABS
5.0000 mg | ORAL_TABLET | Freq: Two times a day (BID) | ORAL | 0 refills | Status: DC
Start: 2017-02-21 — End: 2017-05-10

## 2017-02-21 NOTE — Patient Instructions (Addendum)
Shingrix  Is the new shingles shot, 2 shots over 2-6 months, check with insurance then can mychart or call to see if we have it in or get at [facility], [place] Preventive care refers to lifestyle choices and visits with your health care provider that can promote health and wellness. What does preventive care include?  A yearly physical exam. This is also called an annual well check.  Dental exams once or twice a year.  Routine eye exams. Ask your health care provider how often you should have your eyes checked.  Personal lifestyle choices, including: ? Daily care of your teeth and gums. ? Regular physical activity. ? Eating a healthy diet. ? Avoiding tobacco and drug use. ? Limiting alcohol use. ? Practicing safe sex. ? Taking low-dose aspirin every day starting at age 25. What happens during an annual well check? The services and screenings done by your health care provider during your annual well check will depend on your age, overall health, lifestyle risk factors, and family history of disease. Counseling Your health care provider may ask you questions about your:  Alcohol use.  Tobacco use.  Drug use.  Emotional well-being.  Home and relationship well-being.  Sexual activity.  Eating habits.  Work and work Statistician.  Screening You may have the following tests or measurements:  Height, weight, and BMI.  Blood pressure.  Lipid and cholesterol levels. These may be checked every 5 years, or more frequently if you are over 68 years old.  Skin check.  Lung cancer screening. You may have this screening every year starting at age 65 if you have a 30-pack-year history of smoking and currently smoke or have quit within the past 15 years.  Fecal occult blood test (FOBT) of the stool. You may have this test every year starting at age 62.  Flexible sigmoidoscopy or colonoscopy. You may have a sigmoidoscopy every 5 years or a colonoscopy  every 10 years starting at age 44.  Prostate cancer screening. Recommendations will vary depending on your family history and other risks.  Hepatitis C blood test.  Hepatitis B blood test.  Sexually transmitted disease (STD) testing.  Diabetes screening. This is done by checking your blood sugar (glucose) after you have not eaten for a while (fasting). You may have this done every 1-3 years.  Discuss your test results, treatment options, and if necessary, the need for more tests with your health care provider. Vaccines Your health care provider may recommend certain vaccines, such as:  Influenza vaccine. This is recommended every year.  Tetanus, diphtheria, and acellular pertussis (Tdap, Td) vaccine. You may need a Td booster every 10 years.  Varicella vaccine. You may need this if you have not been vaccinated.  Zoster vaccine. You may need this after age 94.  Measles, mumps, and rubella (MMR) vaccine. You may need at least one dose of MMR if you were born in 1957 or later. You may also need a second dose.  Pneumococcal 13-valent conjugate (PCV13) vaccine. You may need this if you have certain conditions and have not been vaccinated.  Pneumococcal polysaccharide (PPSV23) vaccine. You may need one or two doses if you smoke cigarettes or if you have certain conditions.  Meningococcal vaccine. You may need this if you have certain conditions.  Hepatitis A vaccine. You may need this if you have certain conditions or if you travel or work in places where you may be exposed to hepatitis A.  Hepatitis B vaccine.  You may need this if you have certain conditions or if you travel or work in places where you may be exposed to hepatitis B.  Haemophilus influenzae type b (Hib) vaccine. You may need this if you have certain risk factors.  Talk to your health care provider about which screenings and vaccines you need and how often you need them. This information is not intended to replace  advice given to you by your health care provider. Make sure you discuss any questions you have with your health care provider. Document Released: 01/24/2015 Document Revised: 09/17/2015 Document Reviewed: 10/29/2014 Elsevier Interactive Patient Education  [employer].

## 2017-02-21 NOTE — Assessment & Plan Note (Signed)
Encouraged heart healthy diet, increase exercise, avoid trans fats, consider a krill oil cap daily 

## 2017-02-21 NOTE — Assessment & Plan Note (Signed)
Patient encouraged to maintain heart healthy diet, regular exercise, adequate sleep. Consider daily probiotics. Take medications as prescribed 

## 2017-02-21 NOTE — Assessment & Plan Note (Signed)
minimize simple carbs. Increase exercise as tolerated.  

## 2017-02-21 NOTE — Assessment & Plan Note (Signed)
Tolerating Ritalin no concerns

## 2017-02-21 NOTE — Progress Notes (Signed)
Subjective:  I acted as a Education administrator for Dr. Charlett Blake. [place], [place]  Patient ID: Austin Macdonald, male    DOB: January 30, 1955, 62 y.o.   MRN: [id number]  No chief complaint on file.   HPI  Patient is in today for an annual exam and he reports he feels well. No recent febrile illnesss or hospitalizations. No polyuria or polydipsia. The Ritalin helps him focus and complete his tasks and he denies any concerning side effects such as headaches or palpitations. He is eating well and stays active. He continues to travel a great deal for work. Denies CP/palp/SOB/HA/congestion/fevers/GI or GU c/o. Taking meds as prescribed. Does well with activities of daily living.   Patient Care Team: Mosie Lukes, MD as PCP - General (Family Medicine)   Past Medical History:  Diagnosis Date  . ADD (attention deficit disorder) 12/30/2015  . Benign paroxysmal positional vertigo 12/26/2014  . Chicken pox as a child  . Colon polyps   . Elevated BP 03/03/2015  . Hay fever    seasonal, spring, fall  . Hyperglycemia 03/03/2015  . Measles as a child  . Mumps as a child  . Preventative health care 07/25/2012  . Tinnitus 12/29/2014    Past Surgical History:  Procedure Laterality Date  . knee arthroscopic repair  2004   right    Family History  Problem Relation Age of Onset  . Hypertension Mother   . Heart disease Mother        CHF  . Other Paternal Grandfather        black lung  . Thyroid cancer Daughter   . Dementia Father     Social History   Socioeconomic History  . Marital status: Married    Spouse name: Not on file  . Number of children: Not on file  . Years of education: Not on file  . Highest education level: Not on file  Social Needs  . Financial resource strain: Not on file  . Food insecurity - worry: Not on file  . Food insecurity - inability: Not on file  . Transportation needs - medical: Not on file  . Transportation needs - non-medical: Not on file  Occupational History  . Not on  file  Tobacco Use  . Smoking status: Never Smoker  . Smokeless tobacco: Never Used  Substance and Sexual Activity  . Alcohol use: Yes    Alcohol/week: 0.0 oz    Comment: 10-12 drinks weekly  . Drug use: No  . Sexual activity: Yes    Comment: lives with wife, travels for work [place], no major dietary restrictions  Other Topics Concern  . Not on file  Social History Narrative  . Not on file    Outpatient Medications Prior to Visit  Medication Sig Dispense Refill  . ibuprofen (ADVIL) 200 MG tablet Take 1 tablet (200 mg total) by mouth every 6 (six) hours as needed for pain. 30 tablet 0  . loratadine (CLARITIN) 10 MG tablet Take 10 mg by mouth daily.    . methylphenidate (RITALIN) 5 MG tablet Take 1 tablet (5 mg total) by mouth 2 (two) times daily with breakfast and lunch. January 2019 30 tablet 0  . methylphenidate (RITALIN) 5 MG tablet Take 1 tablet (5 mg total) by mouth daily. December 2018 30 tablet 0  . methylphenidate (RITALIN) 5 MG tablet Take 1 tablet (5 mg total) by mouth daily. November 2018 30 tablet 0   No facility-administered medications prior to visit.  No Known Allergies  Review of Systems  Constitutional: Negative for chills, fever and malaise/fatigue.  HENT: Negative for congestion and hearing loss.   Eyes: Negative for discharge.  Respiratory: Negative for cough, sputum production and shortness of breath.   Cardiovascular: Negative for chest pain, palpitations and leg swelling.  Gastrointestinal: Negative for abdominal pain, blood in stool, constipation, diarrhea, heartburn, nausea and vomiting.  Genitourinary: Negative for dysuria, frequency, hematuria and urgency.  Musculoskeletal: Negative for back pain, falls and myalgias.  Skin: Negative for rash.  Neurological: Negative for dizziness, sensory change, loss of consciousness, weakness and headaches.  Endo/Heme/Allergies: Negative for environmental allergies. Does not bruise/bleed easily.    Psychiatric/Behavioral: Negative for depression and suicidal ideas. The patient is not nervous/anxious and does not have insomnia.        Objective:    Physical Exam  Constitutional: He is oriented to person, place, and time. He appears well-developed and well-nourished. No distress.  HENT:  Head: Normocephalic and atraumatic.  Eyes: Conjunctivae are normal.  Neck: Neck supple. No thyromegaly present.  Cardiovascular: Normal rate, regular rhythm and normal heart sounds.  No murmur heard. Pulmonary/Chest: Effort normal and breath sounds normal. No respiratory distress. He has no wheezes.  Abdominal: Soft. Bowel sounds are normal. He exhibits no mass. There is no tenderness.  Musculoskeletal: He exhibits no edema.  Lymphadenopathy:    He has no cervical adenopathy.  Neurological: He is alert and oriented to person, place, and time.  Skin: Skin is warm and dry.  Psychiatric: He has a normal mood and affect. His behavior is normal.    BP 140/82 (BP Location: Left Arm, Patient Position: Sitting, Cuff Size: Normal)   Pulse (!) 58   Temp (!) 97.4 F (36.3 C) (Oral)   Resp 18   Ht 6\' 3"  (1.905 m)   Wt 212 lb 9.6 oz (96.4 kg)   SpO2 98%   BMI 26.57 kg/m  Wt Readings from Last 3 Encounters:  02/21/17 212 lb 9.6 oz (96.4 kg)  08/16/16 215 lb 3.2 oz (97.6 kg)  05/03/16 216 lb 12.8 oz (98.3 kg)   BP Readings from Last 3 Encounters:  02/21/17 140/82  08/16/16 126/82  05/03/16 130/84     Immunization History  Administered Date(s) Administered  . Influenza Split 10/12/2011, 09/19/2014  . Influenza-Unspecified 09/26/2015  . Td 01/12/1999  . Tdap 03/03/2015  . Zoster 12/26/2014    Health Maintenance  Topic Date Due  . HIV Screening  10/21/1968  . COLONOSCOPY  01/01/2024  . TETANUS/TDAP  03/02/2025  . INFLUENZA VACCINE  Completed  . Hepatitis C Screening  Completed    Lab Results  Component Value Date   WBC 4.8 02/21/2017   HGB 13.7 02/21/2017   HCT 41.2 02/21/2017    PLT 263 02/21/2017   GLUCOSE 99 02/21/2017   CHOL 183 02/21/2017   TRIG 168.0 (H) 02/21/2017   HDL 52.20 02/21/2017   LDLCALC 98 02/21/2017   ALT 9 02/21/2017   AST 15 02/21/2017   NA 142 02/21/2017   K 4.4 02/21/2017   CL 105 02/21/2017   CREATININE 0.77 02/21/2017   BUN 12 02/21/2017   CO2 29 02/21/2017   TSH 2.50 02/21/2017   PSA 0.82 02/21/2017   HGBA1C 5.8 02/21/2017    Lab Results  Component Value Date   TSH 2.50 02/21/2017   Lab Results  Component Value Date   WBC 4.8 02/21/2017   HGB 13.7 02/21/2017   HCT 41.2 02/21/2017   MCV 90.5  02/21/2017   PLT 263 02/21/2017   Lab Results  Component Value Date   NA 142 02/21/2017   K 4.4 02/21/2017   CO2 29 02/21/2017   GLUCOSE 99 02/21/2017   BUN 12 02/21/2017   CREATININE 0.77 02/21/2017   BILITOT 0.8 02/21/2017   ALKPHOS 43 02/21/2017   AST 15 02/21/2017   ALT 9 02/21/2017   PROT 6.4 02/21/2017   ALBUMIN 4.1 02/21/2017   CALCIUM 8.8 02/21/2017   GFR 108.33 02/21/2017   Lab Results  Component Value Date   CHOL 183 02/21/2017   Lab Results  Component Value Date   HDL 52.20 02/21/2017   Lab Results  Component Value Date   LDLCALC 98 02/21/2017   Lab Results  Component Value Date   TRIG 168.0 (H) 02/21/2017   Lab Results  Component Value Date   CHOLHDL 4 02/21/2017   Lab Results  Component Value Date   HGBA1C 5.8 02/21/2017         Assessment & Plan:   Problem List Items Addressed This Visit    Preventative health care    Patient encouraged to maintain heart healthy diet, regular exercise, adequate sleep. Consider daily probiotics. Take medications as prescribed      Relevant Orders   CBC   TSH (Completed)   PSA (Completed)   Hyperglycemia     minimize simple carbs. Increase exercise as tolerated.       Relevant Orders   Hemoglobin A1c (Completed)   Comprehensive metabolic panel (Completed)   ADD (attention deficit disorder)    Tolerating Ritalin no concerns       Hyperlipidemia, mixed    Encouraged heart healthy diet, increase exercise, avoid trans fats, consider a krill oil cap daily      Relevant Orders   Lipid panel (Completed)    Other Visit Diagnoses    High risk medication use    -  Primary   Relevant Orders   Pain Mgmt, Profile 8 w/Conf, U      I have changed Austin Massed "Walt"'s methylphenidate, methylphenidate, and methylphenidate. I am also having him maintain his loratadine and ibuprofen.  Meds ordered this encounter  Medications  . methylphenidate (RITALIN) 5 MG tablet    Sig: Take 1 tablet (5 mg total) by mouth 2 (two) times daily with breakfast and lunch. April 2019    Dispense:  30 tablet    Refill:  0  . methylphenidate (RITALIN) 5 MG tablet    Sig: Take 1 tablet (5 mg total) by mouth daily. March 2019    Dispense:  30 tablet    Refill:  0  . methylphenidate (RITALIN) 5 MG tablet    Sig: Take 1 tablet (5 mg total) by mouth daily. February 2019    Dispense:  30 tablet    Refill:  0    CMA served as Education administrator during this visit. History, Physical and Plan performed by medical provider. Documentation and orders reviewed and attested to.  Penni Homans, MD

## 2017-02-24 LAB — PAIN MGMT, PROFILE 8 W/CONF, U
6 Acetylmorphine: NEGATIVE ng/mL (ref <10)
AMPHETAMINES: NEGATIVE ng/mL (ref <500)
Alcohol Metabolites: NEGATIVE ng/mL (ref <500)
BUPRENORPHINE, URINE: NEGATIVE ng/mL (ref <5)
Benzodiazepines: NEGATIVE ng/mL (ref <100)
Benzoylecgonine: 139 ng/mL — ABNORMAL HIGH (ref <100)
Cocaine Metabolite: POSITIVE ng/mL — AB (ref <150)
Creatinine: 68.9 mg/dL
MDMA: NEGATIVE ng/mL (ref <500)
Marijuana Metabolite: 30 ng/mL — ABNORMAL HIGH (ref <5)
Marijuana Metabolite: POSITIVE ng/mL — AB (ref <20)
OXIDANT: NEGATIVE ug/mL (ref <200)
OXYCODONE: NEGATIVE ng/mL (ref <100)
Opiates: NEGATIVE ng/mL (ref <100)
pH: 7.51 (ref 4.5–9.0)

## 2017-03-21 ENCOUNTER — Encounter: Payer: Self-pay | Admitting: Family Medicine

## 2017-03-21 ENCOUNTER — Ambulatory Visit: Payer: BLUE CROSS/BLUE SHIELD | Admitting: Family Medicine

## 2017-03-21 VITALS — BP 148/86 | HR 78 | Temp 98.2°F | Resp 18 | Wt 214.6 lb

## 2017-03-21 DIAGNOSIS — F988 Other specified behavioral and emotional disorders with onset usually occurring in childhood and adolescence: Secondary | ICD-10-CM | POA: Diagnosis not present

## 2017-03-21 NOTE — Progress Notes (Signed)
Subjective:  I acted as a Education administrator for Dr. Charlett Blake. [place], [place]  Patient ID: Austin Macdonald, male    DOB: [birth year], 62 y.o.   MRN: [id number]  No chief complaint on file.   HPI  Patient is in today for a follow up visit and he is feeling well. He admits to drug use prior to his test at last visit. This occurred the week his mother died and he notes this is usually not an issue. He denies any concerning symptoms during this time. Denies CP/palp/SOB/HA/congestion/fevers/GI or GU c/o. Taking meds as prescribed  Patient Care Team: Mosie Lukes, MD as PCP - General (Family Medicine)   Past Medical History:  Diagnosis Date  . ADD (attention deficit disorder) 12/30/2015  . Benign paroxysmal positional vertigo 12/26/2014  . Chicken pox as a child  . Colon polyps   . Elevated BP 03/03/2015  . Hay fever    seasonal, spring, fall  . Hyperglycemia 03/03/2015  . Measles as a child  . Mumps as a child  . Preventative health care 07/25/2012  . Tinnitus 12/29/2014    Past Surgical History:  Procedure Laterality Date  . knee arthroscopic repair  2004   right    Family History  Problem Relation Age of Onset  . Hypertension Mother   . Heart disease Mother        CHF  . Other Paternal Grandfather        black lung  . Thyroid cancer Daughter   . Dementia Father     Social History   Socioeconomic History  . Marital status: Married    Spouse name: Not on file  . Number of children: Not on file  . Years of education: Not on file  . Highest education level: Not on file  Social Needs  . Financial resource strain: Not on file  . Food insecurity - worry: Not on file  . Food insecurity - inability: Not on file  . Transportation needs - medical: Not on file  . Transportation needs - non-medical: Not on file  Occupational History  . Not on file  Tobacco Use  . Smoking status: Never Smoker  . Smokeless tobacco: Never Used  Substance and Sexual Activity  . Alcohol use: Yes   Alcohol/week: 0.0 oz    Comment: 10-12 drinks weekly  . Drug use: No  . Sexual activity: Yes    Comment: lives with wife, travels for work [place], no major dietary restrictions  Other Topics Concern  . Not on file  Social History Narrative  . Not on file    Outpatient Medications Prior to Visit  Medication Sig Dispense Refill  . ibuprofen (ADVIL) 200 MG tablet Take 1 tablet (200 mg total) by mouth every 6 (six) hours as needed for pain. 30 tablet 0  . loratadine (CLARITIN) 10 MG tablet Take 10 mg by mouth daily.    . methylphenidate (RITALIN) 5 MG tablet Take 1 tablet (5 mg total) by mouth 2 (two) times daily with breakfast and lunch. April 2019 30 tablet 0  . methylphenidate (RITALIN) 5 MG tablet Take 1 tablet (5 mg total) by mouth daily. March 2019 30 tablet 0  . methylphenidate (RITALIN) 5 MG tablet Take 1 tablet (5 mg total) by mouth daily. February 2019 30 tablet 0   No facility-administered medications prior to visit.     No Known Allergies  Review of Systems  Constitutional: Negative for fever and malaise/fatigue.  HENT: Negative for congestion.  Eyes: Negative for blurred vision.  Respiratory: Negative for shortness of breath.   Cardiovascular: Negative for chest pain, palpitations and leg swelling.  Gastrointestinal: Negative for abdominal pain, blood in stool and nausea.  Genitourinary: Negative for dysuria and frequency.  Musculoskeletal: Negative for falls.  Skin: Negative for rash.  Neurological: Negative for dizziness, loss of consciousness and headaches.  Endo/Heme/Allergies: Negative for environmental allergies.  Psychiatric/Behavioral: Negative for depression. The patient is not nervous/anxious.        Objective:    Physical Exam  Constitutional: He is oriented to person, place, and time. He appears well-developed and well-nourished. No distress.  HENT:  Head: Normocephalic and atraumatic.  Nose: Nose normal.  Eyes: Right eye exhibits no discharge.  Left eye exhibits no discharge.  Neck: Normal range of motion. Neck supple.  Cardiovascular: Normal rate and regular rhythm.  No murmur heard. Pulmonary/Chest: Effort normal and breath sounds normal.  Abdominal: Soft. Bowel sounds are normal. There is no tenderness.  Musculoskeletal: He exhibits no edema.  Neurological: He is alert and oriented to person, place, and time.  Skin: Skin is warm and dry.  Psychiatric: He has a normal mood and affect.  Nursing note and vitals reviewed.   BP (!) 148/86   Pulse 78   Temp 98.2 F (36.8 C) (Oral)   Resp 18   Wt 214 lb 9.6 oz (97.3 kg)   SpO2 99%   BMI 26.82 kg/m  Wt Readings from Last 3 Encounters:  03/21/17 214 lb 9.6 oz (97.3 kg)  02/21/17 212 lb 9.6 oz (96.4 kg)  08/16/16 215 lb 3.2 oz (97.6 kg)   BP Readings from Last 3 Encounters:  03/21/17 (!) 148/86  02/21/17 140/82  08/16/16 126/82     Immunization History  Administered Date(s) Administered  . Influenza Split 10/12/2011, 09/19/2014  . Influenza-Unspecified 09/26/2015  . Td 01/12/1999  . Tdap 03/03/2015  . Zoster 12/26/2014    Health Maintenance  Topic Date Due  . HIV Screening  10/21/1968  . COLONOSCOPY  01/01/2024  . TETANUS/TDAP  03/02/2025  . INFLUENZA VACCINE  Completed  . Hepatitis C Screening  Completed    Lab Results  Component Value Date   WBC 4.8 02/21/2017   HGB 13.7 02/21/2017   HCT 41.2 02/21/2017   PLT 263 02/21/2017   GLUCOSE 99 02/21/2017   CHOL 183 02/21/2017   TRIG 168.0 (H) 02/21/2017   HDL 52.20 02/21/2017   LDLCALC 98 02/21/2017   ALT 9 02/21/2017   AST 15 02/21/2017   NA 142 02/21/2017   K 4.4 02/21/2017   CL 105 02/21/2017   CREATININE 0.77 02/21/2017   BUN 12 02/21/2017   CO2 29 02/21/2017   TSH 2.50 02/21/2017   PSA 0.82 02/21/2017   HGBA1C 5.8 02/21/2017    Lab Results  Component Value Date   TSH 2.50 02/21/2017   Lab Results  Component Value Date   WBC 4.8 02/21/2017   HGB 13.7 02/21/2017   HCT 41.2 02/21/2017     MCV 90.5 02/21/2017   PLT 263 02/21/2017   Lab Results  Component Value Date   NA 142 02/21/2017   K 4.4 02/21/2017   CO2 29 02/21/2017   GLUCOSE 99 02/21/2017   BUN 12 02/21/2017   CREATININE 0.77 02/21/2017   BILITOT 0.8 02/21/2017   ALKPHOS 43 02/21/2017   AST 15 02/21/2017   ALT 9 02/21/2017   PROT 6.4 02/21/2017   ALBUMIN 4.1 02/21/2017   CALCIUM 8.8 02/21/2017  GFR 108.33 02/21/2017   Lab Results  Component Value Date   CHOL 183 02/21/2017   Lab Results  Component Value Date   HDL 52.20 02/21/2017   Lab Results  Component Value Date   LDLCALC 98 02/21/2017   Lab Results  Component Value Date   TRIG 168.0 (H) 02/21/2017   Lab Results  Component Value Date   CHOLHDL 4 02/21/2017   Lab Results  Component Value Date   HGBA1C 5.8 02/21/2017         Assessment & Plan:   Problem List Items Addressed This Visit    ADD (attention deficit disorder) - Primary    He agrees to increased drug testing and would like to restart Ritalin once he has had some negative drug tests. When he failed it was the week of his mother's death and he had been in contact with old friends and admits to making bad decisions. He is advised that the mix of Cocaine and ritalin is dangerous and he has to promise not to use both if we restart      Relevant Orders   Pain Mgmt, Profile 8 w/Conf, U      I am having Nehemiah Massed "[place]" maintain his loratadine, ibuprofen, methylphenidate, methylphenidate, and methylphenidate.  No orders of the defined types were placed in this encounter.   CMA served as Education administrator during this visit. History, Physical and Plan performed by medical provider. Documentation and orders reviewed and attested to.  Penni Homans, MD

## 2017-03-21 NOTE — Patient Instructions (Signed)
Send [place] some BP readings through my chart once weekly  Hypertension Hypertension, commonly called high blood pressure, is when the force of blood pumping through the arteries is too strong. The arteries are the blood vessels that carry blood from the heart throughout the body. Hypertension forces the heart to work harder to pump blood and may cause arteries to become narrow or stiff. Having untreated or uncontrolled hypertension can cause heart attacks, strokes, kidney disease, and other problems. A blood pressure reading consists of a higher number over a lower number. Ideally, your blood pressure should be below 120/80. The first ("top") number is called the systolic pressure. It is a measure of the pressure in your arteries as your heart beats. The second ("bottom") number is called the diastolic pressure. It is a measure of the pressure in your arteries as the heart relaxes. What are the causes? The cause of this condition is not known. What increases the risk? Some risk factors for high blood pressure are under your control. Others are not. Factors you can change  Smoking.  Having type 2 diabetes mellitus, high cholesterol, or both.  Not getting enough exercise or physical activity.  Being overweight.  Having too much fat, sugar, calories, or salt (sodium) in your diet.  Drinking too much alcohol. Factors that are difficult or impossible to change  Having chronic kidney disease.  Having a family history of high blood pressure.  Age. Risk increases with age.  Race. You may be at higher risk if you are African-American.  Gender. Men are at higher risk than women before age 50. After age 89, women are at higher risk than men.  Having obstructive sleep apnea.  Stress. What are the signs or symptoms? Extremely high blood pressure (hypertensive crisis) may cause:  Headache.  Anxiety.  Shortness of breath.  Nosebleed.  Nausea and vomiting.  Severe chest pain.  Jerky  movements you cannot control (seizures).  How is this diagnosed? This condition is diagnosed by measuring your blood pressure while you are seated, with your arm resting on a surface. The cuff of the blood pressure monitor will be placed directly against the skin of your upper arm at the level of your heart. It should be measured at least twice using the same arm. Certain conditions can cause a difference in blood pressure between your right and left arms. Certain factors can cause blood pressure readings to be lower or higher than normal (elevated) for a short period of time:  When your blood pressure is higher when you are in a health care provider's office than when you are at home, this is called white coat hypertension. Most people with this condition do not need medicines.  When your blood pressure is higher at home than when you are in a health care provider's office, this is called masked hypertension. Most people with this condition may need medicines to control blood pressure.  If you have a high blood pressure reading during one visit or you have normal blood pressure with other risk factors:  You may be asked to return on a different day to have your blood pressure checked again.  You may be asked to monitor your blood pressure at home for 1 week or longer.  If you are diagnosed with hypertension, you may have other blood or imaging tests to help your health care provider understand your overall risk for other conditions. How is this treated? This condition is treated by making healthy lifestyle changes, such as  eating healthy foods, exercising more, and reducing your alcohol intake. Your health care provider may prescribe medicine if lifestyle changes are not enough to get your blood pressure under control, and if:  Your systolic blood pressure is above 130.  Your diastolic blood pressure is above 80.  Your personal target blood pressure may vary depending on your medical  conditions, your age, and other factors. Follow these instructions at home: Eating and drinking  Eat a diet that is high in fiber and potassium, and low in sodium, added sugar, and fat. An example eating plan is called the DASH (Dietary Approaches to Stop Hypertension) diet. To eat this way: ? Eat plenty of fresh fruits and vegetables. Try to fill half of your plate at each meal with fruits and vegetables. ? Eat whole grains, such as whole wheat pasta, brown rice, or whole grain bread. Fill about one quarter of your plate with whole grains. ? Eat or drink low-fat dairy products, such as skim milk or low-fat yogurt. ? Avoid fatty cuts of meat, processed or cured meats, and poultry with skin. Fill about one quarter of your plate with lean proteins, such as fish, chicken without skin, beans, eggs, and tofu. ? Avoid premade and processed foods. These tend to be higher in sodium, added sugar, and fat.  Reduce your daily sodium intake. Most people with hypertension should eat less than 1,500 mg of sodium a day.  Limit alcohol intake to no more than 1 drink a day for nonpregnant women and 2 drinks a day for men. One drink equals 12 oz of beer, 5 oz of wine, or 1 oz of hard liquor. Lifestyle  Work with your health care provider to maintain a healthy body weight or to lose weight. Ask what an ideal weight is for you.  Get at least 30 minutes of exercise that causes your heart to beat faster (aerobic exercise) most days of the week. Activities may include walking, swimming, or biking.  Include exercise to strengthen your muscles (resistance exercise), such as pilates or lifting weights, as part of your weekly exercise routine. Try to do these types of exercises for 30 minutes at least 3 days a week.  Do not use any products that contain nicotine or tobacco, such as cigarettes and e-cigarettes. If you need help quitting, ask your health care provider.  Monitor your blood pressure at home as told by  your health care provider.  Keep all follow-up visits as told by your health care provider. This is important. Medicines  Take over-the-counter and prescription medicines only as told by your health care provider. Follow directions carefully. Blood pressure medicines must be taken as prescribed.  Do not skip doses of blood pressure medicine. Doing this puts you at risk for problems and can make the medicine less effective.  Ask your health care provider about side effects or reactions to medicines that you should watch for. Contact a health care provider if:  You think you are having a reaction to a medicine you are taking.  You have headaches that keep coming back (recurring).  You feel dizzy.  You have swelling in your ankles.  You have trouble with your vision. Get help right away if:  You develop a severe headache or confusion.  You have unusual weakness or numbness.  You feel faint.  You have severe pain in your chest or abdomen.  You vomit repeatedly.  You have trouble breathing. Summary  Hypertension is when the force of blood pumping  through your arteries is too strong. If this condition is not controlled, it may put you at risk for serious complications.  Your personal target blood pressure may vary depending on your medical conditions, your age, and other factors. For most people, a normal blood pressure is less than 120/80.  Hypertension is treated with lifestyle changes, medicines, or a combination of both. Lifestyle changes include weight loss, eating a healthy, low-sodium diet, exercising more, and limiting alcohol. This information is not intended to replace advice given to you by your health care provider. Make sure you discuss any questions you have with your health care provider. Document Released: 12/28/2004 Document Revised: 11/26/2015 Document Reviewed: 11/26/2015 Elsevier Interactive Patient Education  [employer].

## 2017-03-21 NOTE — Assessment & Plan Note (Signed)
He agrees to increased drug testing and would like to restart Ritalin once he has had some negative drug tests. When he failed it was the week of his mother's death and he had been in contact with old friends and admits to making bad decisions. He is advised that the mix of Cocaine and ritalin is dangerous and he has to promise not to use both if we restart

## 2017-03-25 LAB — PAIN MGMT, PROFILE 8 W/CONF, U
6 Acetylmorphine: NEGATIVE ng/mL (ref <10)
ALCOHOL METABOLITES: POSITIVE ng/mL — AB (ref <500)
AMPHETAMINES: NEGATIVE ng/mL (ref <500)
BENZODIAZEPINES: NEGATIVE ng/mL (ref <100)
Buprenorphine, Urine: NEGATIVE ng/mL (ref <5)
COCAINE METABOLITE: NEGATIVE ng/mL (ref <150)
CREATININE: 44.6 mg/dL
ETHYL SULFATE (ETS): 583 ng/mL — AB (ref <100)
Ethyl Glucuronide (ETG): 3054 ng/mL — ABNORMAL HIGH (ref <500)
MDMA: NEGATIVE ng/mL (ref <500)
Marijuana Metabolite: NEGATIVE ng/mL (ref <20)
OXIDANT: NEGATIVE ug/mL (ref <200)
Opiates: NEGATIVE ng/mL (ref <100)
Oxycodone: NEGATIVE ng/mL (ref <100)
PH: 6.18 (ref 4.5–9.0)

## 2017-04-14 ENCOUNTER — Ambulatory Visit: Payer: BLUE CROSS/BLUE SHIELD | Admitting: Family Medicine

## 2017-05-09 ENCOUNTER — Encounter: Payer: Self-pay | Admitting: Family Medicine

## 2017-05-09 NOTE — Progress Notes (Signed)
[facility] date of exam 12/31/13  Dx Polypoid colonic mucosal folds No hyperplastic colonic polyp or adenomatous change identified Negative for malignancy Tubular Adenoma Multiple fragments No high grade dysplasia or malignancy identified.   See media for full report.

## 2017-05-10 ENCOUNTER — Encounter: Payer: Self-pay | Admitting: Family Medicine

## 2017-05-10 ENCOUNTER — Ambulatory Visit: Payer: BLUE CROSS/BLUE SHIELD | Admitting: Family Medicine

## 2017-05-10 VITALS — BP 130/84 | HR 63 | Temp 98.3°F | Resp 18 | Ht 75.2 in | Wt 214.0 lb

## 2017-05-10 DIAGNOSIS — R739 Hyperglycemia, unspecified: Secondary | ICD-10-CM | POA: Diagnosis not present

## 2017-05-10 DIAGNOSIS — I10 Essential (primary) hypertension: Secondary | ICD-10-CM | POA: Diagnosis not present

## 2017-05-10 DIAGNOSIS — E782 Mixed hyperlipidemia: Secondary | ICD-10-CM | POA: Diagnosis not present

## 2017-05-10 DIAGNOSIS — Z79899 Other long term (current) drug therapy: Secondary | ICD-10-CM | POA: Diagnosis not present

## 2017-05-10 DIAGNOSIS — F988 Other specified behavioral and emotional disorders with onset usually occurring in childhood and adolescence: Secondary | ICD-10-CM | POA: Diagnosis not present

## 2017-05-10 MED ORDER — METHYLPHENIDATE HCL 5 MG PO TABS: 5.0000 mg | ORAL_TABLET | Freq: Every day | ORAL | 0 refills | Status: DC

## 2017-05-10 MED ORDER — METHYLPHENIDATE HCL 5 MG PO TABS: 5.0000 mg | ORAL_TABLET | Freq: Two times a day (BID) | ORAL | 0 refills | Status: DC

## 2017-05-10 NOTE — Patient Instructions (Addendum)
Lidocaine gel to hand and stress balls Carbohydrate Counting for Diabetes Mellitus, Adult Carbohydrate counting is a method for keeping track of how many carbohydrates you eat. Eating carbohydrates naturally increases the amount of sugar (glucose) in the blood. Counting how many carbohydrates you eat helps keep your blood glucose within normal limits, which helps you manage your diabetes (diabetes mellitus). It is important to know how many carbohydrates you can safely have in each meal. This is different for every person. A diet and nutrition specialist (registered dietitian) can help you make a meal plan and calculate how many carbohydrates you should have at each meal and snack. Carbohydrates are found in the following foods:  Grains, such as breads and cereals.  Dried beans and soy products.  Starchy vegetables, such as potatoes, peas, and corn.  Fruit and fruit juices.  Milk and yogurt.  Sweets and snack foods, such as cake, cookies, candy, chips, and soft drinks.  How do I count carbohydrates? There are two ways to count carbohydrates in food. You can use either of the methods or a combination of both. Reading "Nutrition Facts" on packaged food The "Nutrition Facts" list is included on the labels of almost all packaged foods and beverages in the [place] It includes:  The serving size.  Information about nutrients in each serving, including the grams (g) of carbohydrate per serving.  To use the "Nutrition Facts":  Decide how many servings you will have.  Multiply the number of servings by the number of carbohydrates per serving.  The resulting number is the total amount of carbohydrates that you will be having.  Learning standard serving sizes of other foods When you eat foods containing carbohydrates that are not packaged or do not include "Nutrition Facts" on the label, you need to measure the servings in order to count the amount of carbohydrates:  Measure the foods that  you will eat with a food scale or measuring cup, if needed.  Decide how many standard-size servings you will eat.  Multiply the number of servings by 15. Most carbohydrate-rich foods have about 15 g of carbohydrates per serving. ? For example, if you eat 8 oz (170 g) of strawberries, you will have eaten 2 servings and 30 g of carbohydrates (2 servings x 15 g = 30 g).  For foods that have more than one food mixed, such as soups and casseroles, you must count the carbohydrates in each food that is included.  The following list contains standard serving sizes of common carbohydrate-rich foods. Each of these servings has about 15 g of carbohydrates:   hamburger bun or  English muffin.   oz (15 mL) syrup.   oz (14 g) jelly.  1 slice of bread.  1 six-inch tortilla.  3 oz (85 g) cooked rice or pasta.  4 oz (113 g) cooked dried beans.  4 oz (113 g) starchy vegetable, such as peas, corn, or potatoes.  4 oz (113 g) hot cereal.  4 oz (113 g) mashed potatoes or  of a large baked potato.  4 oz (113 g) canned or frozen fruit.  4 oz (120 mL) fruit juice.  4-6 crackers.  6 chicken nuggets.  6 oz (170 g) unsweetened dry cereal.  6 oz (170 g) plain fat-free yogurt or yogurt sweetened with artificial sweeteners.  8 oz (240 mL) milk.  8 oz (170 g) fresh fruit or one small piece of fruit.  24 oz (680 g) popped popcorn.  Example of carbohydrate counting Sample meal  3 oz (85 g) chicken breast.  6 oz (170 g) brown rice.  4 oz (113 g) corn.  8 oz (240 mL) milk.  8 oz (170 g) strawberries with sugar-free whipped topping. Carbohydrate calculation 1. Identify the foods that contain carbohydrates: ? Rice. ? Corn. ? Milk. ? Strawberries. 2. Calculate how many servings you have of each food: ? 2 servings rice. ? 1 serving corn. ? 1 serving milk. ? 1 serving strawberries. 3. Multiply each number of servings by 15 g: ? 2 servings rice x 15 g = 30 g. ? 1 serving corn x  15 g = 15 g. ? 1 serving milk x 15 g = 15 g. ? 1 serving strawberries x 15 g = 15 g. 4. Add together all of the amounts to find the total grams of carbohydrates eaten: ? 30 g + 15 g + 15 g + 15 g = 75 g of carbohydrates total. This information is not intended to replace advice given to you by your health care provider. Make sure you discuss any questions you have with your health care provider. Document Released: 12/28/2004 Document Revised: 07/18/2015 Document Reviewed: 06/11/2015 Elsevier Interactive Patient Education  2018 [employer].  Carbohydrate Counting for Diabetes Mellitus, Adult Carbohydrate counting is a method for keeping track of how many carbohydrates you eat. Eating carbohydrates naturally increases the amount of sugar (glucose) in the blood. Counting how many carbohydrates you eat helps keep your blood glucose within normal limits, which helps you manage your diabetes (diabetes mellitus). It is important to know how many carbohydrates you can safely have in each meal. This is different for every person. A diet and nutrition specialist (registered dietitian) can help you make a meal plan and calculate how many carbohydrates you should have at each meal and snack. Carbohydrates are found in the following foods:  Grains, such as breads and cereals.  Dried beans and soy products.  Starchy vegetables, such as potatoes, peas, and corn.  Fruit and fruit juices.  Milk and yogurt.  Sweets and snack foods, such as cake, cookies, candy, chips, and soft drinks.  How do I count carbohydrates? There are two ways to count carbohydrates in food. You can use either of the methods or a combination of both. Reading "Nutrition Facts" on packaged food The "Nutrition Facts" list is included on the labels of almost all packaged foods and beverages in the [place] It includes:  The serving size.  Information about nutrients in each serving, including the grams (g) of carbohydrate per  serving.  To use the "Nutrition Facts":  Decide how many servings you will have.  Multiply the number of servings by the number of carbohydrates per serving.  The resulting number is the total amount of carbohydrates that you will be having.  Learning standard serving sizes of other foods When you eat foods containing carbohydrates that are not packaged or do not include "Nutrition Facts" on the label, you need to measure the servings in order to count the amount of carbohydrates:  Measure the foods that you will eat with a food scale or measuring cup, if needed.  Decide how many standard-size servings you will eat.  Multiply the number of servings by 15. Most carbohydrate-rich foods have about 15 g of carbohydrates per serving. ? For example, if you eat 8 oz (170 g) of strawberries, you will have eaten 2 servings and 30 g of carbohydrates (2 servings x 15 g = 30 g).  For foods that have  more than one food mixed, such as soups and casseroles, you must count the carbohydrates in each food that is included.  The following list contains standard serving sizes of common carbohydrate-rich foods. Each of these servings has about 15 g of carbohydrates:   hamburger bun or  English muffin.   oz (15 mL) syrup.   oz (14 g) jelly.  1 slice of bread.  1 six-inch tortilla.  3 oz (85 g) cooked rice or pasta.  4 oz (113 g) cooked dried beans.  4 oz (113 g) starchy vegetable, such as peas, corn, or potatoes.  4 oz (113 g) hot cereal.  4 oz (113 g) mashed potatoes or  of a large baked potato.  4 oz (113 g) canned or frozen fruit.  4 oz (120 mL) fruit juice.  4-6 crackers.  6 chicken nuggets.  6 oz (170 g) unsweetened dry cereal.  6 oz (170 g) plain fat-free yogurt or yogurt sweetened with artificial sweeteners.  8 oz (240 mL) milk.  8 oz (170 g) fresh fruit or one small piece of fruit.  24 oz (680 g) popped popcorn.  Example of carbohydrate counting Sample meal  3  oz (85 g) chicken breast.  6 oz (170 g) brown rice.  4 oz (113 g) corn.  8 oz (240 mL) milk.  8 oz (170 g) strawberries with sugar-free whipped topping. Carbohydrate calculation 5. Identify the foods that contain carbohydrates: ? Rice. ? Corn. ? Milk. ? Strawberries. 6. Calculate how many servings you have of each food: ? 2 servings rice. ? 1 serving corn. ? 1 serving milk. ? 1 serving strawberries. 7. Multiply each number of servings by 15 g: ? 2 servings rice x 15 g = 30 g. ? 1 serving corn x 15 g = 15 g. ? 1 serving milk x 15 g = 15 g. ? 1 serving strawberries x 15 g = 15 g. 8. Add together all of the amounts to find the total grams of carbohydrates eaten: ? 30 g + 15 g + 15 g + 15 g = 75 g of carbohydrates total. This information is not intended to replace advice given to you by your health care provider. Make sure you discuss any questions you have with your health care provider. Document Released: 12/28/2004 Document Revised: 07/18/2015 Document Reviewed: 06/11/2015 Elsevier Interactive Patient Education  [employer].

## 2017-05-10 NOTE — Progress Notes (Signed)
Subjective:  I acted as a Education administrator for [employer]. Austin Macdonald, Austin Macdonald   Patient ID: Austin Macdonald, male    DOB: 30-Jun-1955, 62 y.o.   MRN: [id number]  Chief Complaint  Patient presents with  . Follow-up    HPI  Patient is in today for follow up visit and he is doing well. He has been avoiding OTC drugs since last visit. No recent febrile illness or hospitalizations. No polyuria or polydipsia. Denies CP/palp/SOB/HA/congestion/fevers/GI or GU c/o. Taking meds as prescribed  Patient Care Team: Mosie Lukes, MD as PCP - General (Family Medicine)   Past Medical History:  Diagnosis Date  . ADD (attention deficit disorder) 12/30/2015  . Benign paroxysmal positional vertigo 12/26/2014  . Chicken pox as a child  . Colon polyps   . Elevated BP 03/03/2015  . Hay fever    seasonal, spring, fall  . Hyperglycemia 03/03/2015  . Measles as a child  . Mumps as a child  . Preventative health care 07/25/2012  . Tinnitus 12/29/2014    Past Surgical History:  Procedure Laterality Date  . knee arthroscopic repair  2004   right    Family History  Problem Relation Age of Onset  . Hypertension Mother   . Heart disease Mother        CHF  . Other Paternal Grandfather        black lung  . Thyroid cancer Daughter   . Dementia Father     Social History   Socioeconomic History  . Marital status: Married    Spouse name: Not on file  . Number of children: Not on file  . Years of education: Not on file  . Highest education level: Not on file  Occupational History  . Not on file  Social Needs  . Financial resource strain: Not on file  . Food insecurity:    Worry: Not on file    Inability: Not on file  . Transportation needs:    Medical: Not on file    Non-medical: Not on file  Tobacco Use  . Smoking status: Never Smoker  . Smokeless tobacco: Never Used  Substance and Sexual Activity  . Alcohol use: Yes    Alcohol/week: 0.0 oz    Comment: 10-12 drinks weekly  . Drug use: No  .  Sexual activity: Yes    Comment: lives with wife, travels for work [place], no major dietary restrictions  Lifestyle  . Physical activity:    Days per week: Not on file    Minutes per session: Not on file  . Stress: Not on file  Relationships  . Social connections:    Talks on phone: Not on file    Gets together: Not on file    Attends religious service: Not on file    Active member of club or organization: Not on file    Attends meetings of clubs or organizations: Not on file    Relationship status: Not on file  . Intimate partner violence:    Fear of current or ex partner: Not on file    Emotionally abused: Not on file    Physically abused: Not on file    Forced sexual activity: Not on file  Other Topics Concern  . Not on file  Social History Narrative  . Not on file    Outpatient Medications Prior to Visit  Medication Sig Dispense Refill  . ibuprofen (ADVIL) 200 MG tablet Take 1 tablet (200 mg total) by mouth every 6 (six)  hours as needed for pain. 30 tablet 0  . loratadine (CLARITIN) 10 MG tablet Take 10 mg by mouth daily.    . methylphenidate (RITALIN) 5 MG tablet Take 1 tablet (5 mg total) by mouth 2 (two) times daily with breakfast and lunch. April 2019 30 tablet 0  . methylphenidate (RITALIN) 5 MG tablet Take 1 tablet (5 mg total) by mouth daily. March 2019 30 tablet 0  . methylphenidate (RITALIN) 5 MG tablet Take 1 tablet (5 mg total) by mouth daily. February 2019 30 tablet 0   No facility-administered medications prior to visit.     No Known Allergies  Review of Systems  Constitutional: Negative for fever and malaise/fatigue.  HENT: Negative for congestion.   Eyes: Negative for blurred vision.  Respiratory: Negative for shortness of breath.   Cardiovascular: Negative for chest pain, palpitations and leg swelling.  Gastrointestinal: Negative for abdominal pain, blood in stool and nausea.  Genitourinary: Negative for dysuria and frequency.  Musculoskeletal:  Negative for falls.  Skin: Negative for rash.  Neurological: Negative for dizziness, loss of consciousness and headaches.  Endo/Heme/Allergies: Negative for environmental allergies.  Psychiatric/Behavioral: Negative for depression. The patient is not nervous/anxious.        Objective:    Physical Exam  Constitutional: He is oriented to person, place, and time. He appears well-developed and well-nourished. No distress.  HENT:  Head: Normocephalic and atraumatic.  Nose: Nose normal.  Eyes: Right eye exhibits no discharge. Left eye exhibits no discharge.  Neck: Normal range of motion. Neck supple.  Cardiovascular: Normal rate and regular rhythm.  No murmur heard. Pulmonary/Chest: Effort normal and breath sounds normal.  Abdominal: Soft. Bowel sounds are normal. There is no tenderness.  Musculoskeletal: He exhibits no edema.  Neurological: He is alert and oriented to person, place, and time.  Skin: Skin is warm and dry.  Psychiatric: He has a normal mood and affect.  Nursing note and vitals reviewed.   BP 130/84   Pulse 63   Temp 98.3 F (36.8 C) (Oral)   Resp 18   Ht 6' 3.2" (1.91 m)   Wt 214 lb (97.1 kg)   SpO2 98%   BMI 26.61 kg/m  Wt Readings from Last 3 Encounters:  05/10/17 214 lb (97.1 kg)  03/21/17 214 lb 9.6 oz (97.3 kg)  02/21/17 212 lb 9.6 oz (96.4 kg)   BP Readings from Last 3 Encounters:  05/10/17 130/84  03/21/17 (!) 148/86  02/21/17 140/82     Immunization History  Administered Date(s) Administered  . Influenza Split 10/12/2011, 09/19/2014  . Influenza-Unspecified 09/26/2015  . Td 01/12/1999  . Tdap 03/03/2015  . Zoster 12/26/2014    Health Maintenance  Topic Date Due  . HIV Screening  10/21/1968  . INFLUENZA VACCINE  08/11/2017  . COLONOSCOPY  01/01/2024  . TETANUS/TDAP  03/02/2025  . Hepatitis C Screening  Completed    Lab Results  Component Value Date   WBC 4.8 02/21/2017   HGB 13.7 02/21/2017   HCT 41.2 02/21/2017   PLT 263  02/21/2017   GLUCOSE 99 02/21/2017   CHOL 183 02/21/2017   TRIG 168.0 (H) 02/21/2017   HDL 52.20 02/21/2017   LDLCALC 98 02/21/2017   ALT 9 02/21/2017   AST 15 02/21/2017   NA 142 02/21/2017   K 4.4 02/21/2017   CL 105 02/21/2017   CREATININE 0.77 02/21/2017   BUN 12 02/21/2017   CO2 29 02/21/2017   TSH 2.50 02/21/2017   PSA 0.82 02/21/2017  HGBA1C 5.8 02/21/2017    Lab Results  Component Value Date   TSH 2.50 02/21/2017   Lab Results  Component Value Date   WBC 4.8 02/21/2017   HGB 13.7 02/21/2017   HCT 41.2 02/21/2017   MCV 90.5 02/21/2017   PLT 263 02/21/2017   Lab Results  Component Value Date   NA 142 02/21/2017   K 4.4 02/21/2017   CO2 29 02/21/2017   GLUCOSE 99 02/21/2017   BUN 12 02/21/2017   CREATININE 0.77 02/21/2017   BILITOT 0.8 02/21/2017   ALKPHOS 43 02/21/2017   AST 15 02/21/2017   ALT 9 02/21/2017   PROT 6.4 02/21/2017   ALBUMIN 4.1 02/21/2017   CALCIUM 8.8 02/21/2017   GFR 108.33 02/21/2017   Lab Results  Component Value Date   CHOL 183 02/21/2017   Lab Results  Component Value Date   HDL 52.20 02/21/2017   Lab Results  Component Value Date   LDLCALC 98 02/21/2017   Lab Results  Component Value Date   TRIG 168.0 (H) 02/21/2017   Lab Results  Component Value Date   CHOLHDL 4 02/21/2017   Lab Results  Component Value Date   HGBA1C 5.8 02/21/2017         Assessment & Plan:   Problem List Items Addressed This Visit    Hyperglycemia    hgba1c acceptable, minimize simple carbs. Increase exercise as tolerated.       High blood pressure    Patient reports good numbers at home and improved on recheck here. No changes      ADD (attention deficit disorder) - Primary    Allowed refills on his low dose Ritalin which helps him get through work      Relevant Medications   methylphenidate (RITALIN) 5 MG tablet   methylphenidate (RITALIN) 5 MG tablet   methylphenidate (RITALIN) 5 MG tablet   Hyperlipidemia, mixed     Maintain heart healthy       Other Visit Diagnoses    High risk medication use       Relevant Orders   Pain Mgmt, Profile 8 w/Conf, U      I have changed Austin Macdonald "Austin Macdonald"'s methylphenidate, methylphenidate, and methylphenidate. I am also having him maintain his loratadine and ibuprofen.  Meds ordered this encounter  Medications  . methylphenidate (RITALIN) 5 MG tablet    Sig: Take 1 tablet (5 mg total) by mouth 2 (two) times daily with breakfast and lunch. July 2019    Dispense:  30 tablet    Refill:  0  . methylphenidate (RITALIN) 5 MG tablet    Sig: Take 1 tablet (5 mg total) by mouth daily. June 2019    Dispense:  30 tablet    Refill:  0  . methylphenidate (RITALIN) 5 MG tablet    Sig: Take 1 tablet (5 mg total) by mouth daily. May 2019    Dispense:  30 tablet    Refill:  0    CMA served as Education administrator during this visit. History, Physical and Plan performed by medical provider. Documentation and orders reviewed and attested to.  Penni Homans, MD

## 2017-05-11 LAB — PAIN MGMT, PROFILE 8 W/CONF, U
6 ACETYLMORPHINE: NEGATIVE ng/mL (ref <10)
ALCOHOL METABOLITES: NEGATIVE ng/mL (ref <500)
Amphetamines: NEGATIVE ng/mL (ref <500)
BENZODIAZEPINES: NEGATIVE ng/mL (ref <100)
Buprenorphine, Urine: NEGATIVE ng/mL (ref <5)
COCAINE METABOLITE: NEGATIVE ng/mL (ref <150)
CREATININE: 112.2 mg/dL
MDMA: NEGATIVE ng/mL (ref <500)
Marijuana Metabolite: NEGATIVE ng/mL (ref <20)
OPIATES: NEGATIVE ng/mL (ref <100)
OXYCODONE: NEGATIVE ng/mL (ref <100)
Oxidant: NEGATIVE ug/mL (ref <200)
PH: 6.59 (ref 4.5–9.0)

## 2017-05-15 NOTE — Assessment & Plan Note (Signed)
Patient reports good numbers at home and improved on recheck here. No changes

## 2017-05-15 NOTE — Assessment & Plan Note (Signed)
hgba1c acceptable, minimize simple carbs. Increase exercise as tolerated.  

## 2017-05-15 NOTE — Assessment & Plan Note (Signed)
Allowed refills on his low dose Ritalin which helps him get through work

## 2017-05-15 NOTE — Assessment & Plan Note (Signed)
Maintain heart healthy

## 2017-06-28 ENCOUNTER — Encounter: Payer: Self-pay | Admitting: Family Medicine

## 2017-06-28 ENCOUNTER — Ambulatory Visit: Payer: BLUE CROSS/BLUE SHIELD | Admitting: Family Medicine

## 2017-06-28 VITALS — BP 130/80 | HR 60 | Temp 98.1°F | Resp 18 | Wt 217.2 lb

## 2017-06-28 DIAGNOSIS — F988 Other specified behavioral and emotional disorders with onset usually occurring in childhood and adolescence: Secondary | ICD-10-CM | POA: Diagnosis not present

## 2017-06-28 DIAGNOSIS — Z79899 Other long term (current) drug therapy: Secondary | ICD-10-CM | POA: Diagnosis not present

## 2017-06-28 DIAGNOSIS — E782 Mixed hyperlipidemia: Secondary | ICD-10-CM

## 2017-06-28 DIAGNOSIS — R739 Hyperglycemia, unspecified: Secondary | ICD-10-CM

## 2017-06-28 MED ORDER — METHYLPHENIDATE HCL 5 MG PO TABS: 5.0000 mg | ORAL_TABLET | Freq: Every day | ORAL | 0 refills | Status: DC

## 2017-06-28 NOTE — Assessment & Plan Note (Signed)
Encouraged heart healthy diet, increase exercise, avoid trans fats, consider a krill oil cap daily 

## 2017-06-28 NOTE — Assessment & Plan Note (Signed)
Doing well on Ritalin 5 mg given August prescription

## 2017-06-28 NOTE — Progress Notes (Signed)
Subjective:  I acted as a Education administrator for Dr. Charlett Blake. [place], [place]  Patient ID: Austin Macdonald, male    DOB: April 30, 1955, 62 y.o.   MRN: [id number]  No chief complaint on file.   HPI  Patient is in today for a 7 week follow up and he is feeling well. No recent febrile illness or hospitalizations. He is doing well completing tasks and getting his work done on 5 mg of Ritalin. No concenring side effects. Denies CP/palp/SOB/HA/congestion/fevers/GI or GU c/o. Taking meds as prescribed  Patient Care Team: Mosie Lukes, MD as PCP - General (Family Medicine)   Past Medical History:  Diagnosis Date  . ADD (attention deficit disorder) 12/30/2015  . Benign paroxysmal positional vertigo 12/26/2014  . Chicken pox as a child  . Colon polyps   . Elevated BP 03/03/2015  . Hay fever    seasonal, spring, fall  . Hyperglycemia 03/03/2015  . Measles as a child  . Mumps as a child  . Preventative health care 07/25/2012  . Tinnitus 12/29/2014    Past Surgical History:  Procedure Laterality Date  . knee arthroscopic repair  2004   right    Family History  Problem Relation Age of Onset  . Hypertension Mother   . Heart disease Mother        CHF  . Other Paternal Grandfather        black lung  . Thyroid cancer Daughter   . Dementia Father     Social History   Socioeconomic History  . Marital status: Married    Spouse name: Not on file  . Number of children: Not on file  . Years of education: Not on file  . Highest education level: Not on file  Occupational History  . Not on file  Social Needs  . Financial resource strain: Not on file  . Food insecurity:    Worry: Not on file    Inability: Not on file  . Transportation needs:    Medical: Not on file    Non-medical: Not on file  Tobacco Use  . Smoking status: Never Smoker  . Smokeless tobacco: Never Used  Substance and Sexual Activity  . Alcohol use: Yes    Alcohol/week: 0.0 oz    Comment: 10-12 drinks weekly  . Drug use:  No  . Sexual activity: Yes    Comment: lives with wife, travels for work [place], no major dietary restrictions  Lifestyle  . Physical activity:    Days per week: Not on file    Minutes per session: Not on file  . Stress: Not on file  Relationships  . Social connections:    Talks on phone: Not on file    Gets together: Not on file    Attends religious service: Not on file    Active member of club or organization: Not on file    Attends meetings of clubs or organizations: Not on file    Relationship status: Not on file  . Intimate partner violence:    Fear of current or ex partner: Not on file    Emotionally abused: Not on file    Physically abused: Not on file    Forced sexual activity: Not on file  Other Topics Concern  . Not on file  Social History Narrative  . Not on file    Outpatient Medications Prior to Visit  Medication Sig Dispense Refill  . ibuprofen (ADVIL) 200 MG tablet Take 1 tablet (200 mg total) by  mouth every 6 (six) hours as needed for pain. 30 tablet 0  . loratadine (CLARITIN) 10 MG tablet Take 10 mg by mouth daily.    . methylphenidate (RITALIN) 5 MG tablet Take 1 tablet (5 mg total) by mouth daily. June 2019 30 tablet 0  . methylphenidate (RITALIN) 5 MG tablet Take 1 tablet (5 mg total) by mouth daily. May 2019 30 tablet 0  . methylphenidate (RITALIN) 5 MG tablet Take 1 tablet (5 mg total) by mouth 2 (two) times daily with breakfast and lunch. July 2019 30 tablet 0   No facility-administered medications prior to visit.     No Known Allergies  Review of Systems  Constitutional: Negative for fever and malaise/fatigue.  HENT: Negative for congestion.   Eyes: Negative for blurred vision.  Respiratory: Negative for shortness of breath.   Cardiovascular: Negative for chest pain, palpitations and leg swelling.  Gastrointestinal: Negative for abdominal pain, blood in stool and nausea.  Genitourinary: Negative for dysuria and frequency.  Musculoskeletal:  Negative for falls.  Skin: Negative for rash.  Neurological: Negative for dizziness, loss of consciousness and headaches.  Endo/Heme/Allergies: Negative for environmental allergies.  Psychiatric/Behavioral: Negative for depression. The patient is not nervous/anxious.        Objective:    Physical Exam  Constitutional: He is oriented to person, place, and time. He appears well-developed and well-nourished. No distress.  HENT:  Head: Normocephalic and atraumatic.  Eyes: Conjunctivae are normal.  Neck: Neck supple. No thyromegaly present.  Cardiovascular: Normal rate, regular rhythm and normal heart sounds.  No murmur heard. Pulmonary/Chest: Effort normal and breath sounds normal. No respiratory distress. He has no wheezes.  Abdominal: Soft. Bowel sounds are normal. He exhibits no mass. There is no tenderness.  Musculoskeletal: He exhibits no edema.  Lymphadenopathy:    He has no cervical adenopathy.  Neurological: He is alert and oriented to person, place, and time.  Skin: Skin is warm and dry.  Psychiatric: He has a normal mood and affect. His behavior is normal.    BP 130/80 (BP Location: Left Arm, Patient Position: Sitting, Cuff Size: Normal)   Pulse 60   Temp 98.1 F (36.7 C) (Oral)   Resp 18   Wt 217 lb 3.2 oz (98.5 kg)   SpO2 97%   BMI 27.01 kg/m  Wt Readings from Last 3 Encounters:  06/28/17 217 lb 3.2 oz (98.5 kg)  05/10/17 214 lb (97.1 kg)  03/21/17 214 lb 9.6 oz (97.3 kg)   BP Readings from Last 3 Encounters:  06/28/17 130/80  05/10/17 130/84  03/21/17 (!) 148/86     Immunization History  Administered Date(s) Administered  . Influenza Split 10/12/2011, 09/19/2014  . Influenza-Unspecified 09/26/2015  . Td 01/12/1999  . Tdap 03/03/2015  . Zoster 12/26/2014    Health Maintenance  Topic Date Due  . HIV Screening  10/21/1968  . INFLUENZA VACCINE  08/11/2017  . COLONOSCOPY  01/01/2024  . TETANUS/TDAP  03/02/2025  . Hepatitis C Screening  Completed      Lab Results  Component Value Date   WBC 4.8 02/21/2017   HGB 13.7 02/21/2017   HCT 41.2 02/21/2017   PLT 263 02/21/2017   GLUCOSE 99 02/21/2017   CHOL 183 02/21/2017   TRIG 168.0 (H) 02/21/2017   HDL 52.20 02/21/2017   LDLCALC 98 02/21/2017   ALT 9 02/21/2017   AST 15 02/21/2017   NA 142 02/21/2017   K 4.4 02/21/2017   CL 105 02/21/2017   CREATININE 0.77  02/21/2017   BUN 12 02/21/2017   CO2 29 02/21/2017   TSH 2.50 02/21/2017   PSA 0.82 02/21/2017   HGBA1C 5.8 02/21/2017    Lab Results  Component Value Date   TSH 2.50 02/21/2017   Lab Results  Component Value Date   WBC 4.8 02/21/2017   HGB 13.7 02/21/2017   HCT 41.2 02/21/2017   MCV 90.5 02/21/2017   PLT 263 02/21/2017   Lab Results  Component Value Date   NA 142 02/21/2017   K 4.4 02/21/2017   CO2 29 02/21/2017   GLUCOSE 99 02/21/2017   BUN 12 02/21/2017   CREATININE 0.77 02/21/2017   BILITOT 0.8 02/21/2017   ALKPHOS 43 02/21/2017   AST 15 02/21/2017   ALT 9 02/21/2017   PROT 6.4 02/21/2017   ALBUMIN 4.1 02/21/2017   CALCIUM 8.8 02/21/2017   GFR 108.33 02/21/2017   Lab Results  Component Value Date   CHOL 183 02/21/2017   Lab Results  Component Value Date   HDL 52.20 02/21/2017   Lab Results  Component Value Date   LDLCALC 98 02/21/2017   Lab Results  Component Value Date   TRIG 168.0 (H) 02/21/2017   Lab Results  Component Value Date   CHOLHDL 4 02/21/2017   Lab Results  Component Value Date   HGBA1C 5.8 02/21/2017         Assessment & Plan:   Problem List Items Addressed This Visit    Hyperglycemia    hgba1c acceptable, minimize simple carbs. Increase exercise as tolerated.       ADD (attention deficit disorder)    Doing well on Ritalin 5 mg given August prescription      Relevant Medications   methylphenidate (RITALIN) 5 MG tablet   Hyperlipidemia, mixed    Encouraged heart healthy diet, increase exercise, avoid trans fats, consider a krill oil cap daily        Other Visit Diagnoses    High risk medication use    -  Primary   Relevant Orders   Pain Mgmt, Profile 8 w/Conf, U      I have changed Austin Massed "Walt"'s methylphenidate. I am also having him maintain his loratadine, ibuprofen, methylphenidate, and methylphenidate.  Meds ordered this encounter  Medications  . methylphenidate (RITALIN) 5 MG tablet    Sig: Take 1 tablet (5 mg total) by mouth daily. August 2019    Dispense:  30 tablet    Refill:  0    CMA served as Education administrator during this visit. History, Physical and Plan performed by medical provider. Documentation and orders reviewed and attested to.  Penni Homans, MD

## 2017-06-28 NOTE — Assessment & Plan Note (Signed)
hgba1c acceptable, minimize simple carbs. Increase exercise as tolerated.  

## 2017-06-28 NOTE — Patient Instructions (Signed)

## 2017-06-29 LAB — PAIN MGMT, PROFILE 8 W/CONF, U
6 Acetylmorphine: NEGATIVE ng/mL (ref <10)
ALCOHOL METABOLITES: NEGATIVE ng/mL (ref <500)
Amphetamines: NEGATIVE ng/mL (ref <500)
BENZODIAZEPINES: NEGATIVE ng/mL (ref <100)
Buprenorphine, Urine: NEGATIVE ng/mL (ref <5)
COCAINE METABOLITE: NEGATIVE ng/mL (ref <150)
CREATININE: 77.8 mg/dL
MDMA: NEGATIVE ng/mL (ref <500)
Marijuana Metabolite: NEGATIVE ng/mL (ref <20)
OXIDANT: NEGATIVE ug/mL (ref <200)
Opiates: NEGATIVE ng/mL (ref <100)
Oxycodone: NEGATIVE ng/mL (ref <100)
PH: 6.92 (ref 4.5–9.0)

## 2017-07-11 ENCOUNTER — Telehealth: Payer: Self-pay | Admitting: Family Medicine

## 2017-07-11 DIAGNOSIS — F988 Other specified behavioral and emotional disorders with onset usually occurring in childhood and adolescence: Secondary | ICD-10-CM

## 2017-07-11 MED ORDER — METHYLPHENIDATE HCL 5 MG PO TABS: 5.0000 mg | ORAL_TABLET | Freq: Every day | ORAL | 0 refills | Status: DC

## 2017-07-11 NOTE — Telephone Encounter (Signed)
[facility] [place] [facility] over the phone by author. New rx for July printed for preferred pharmacy while pt. Out of town X 2 weeks. Pt.stated he is completely out of his ritalin.  [facility] [place] [address] [phone]  Per [facility] pharmacy tech at [facility], ritalin rx cannot be faxed. Pt. made aware, and requested rx to be snail mailed to him for him to bring to pharmacy to fill. Please send signed ritalin rx to:  [address] [place] [id number].

## 2017-07-11 NOTE — Telephone Encounter (Signed)
Copied from [place] [phone]. Topic: Quick Communication - Rx Refill/Question >> Jul 11, 2017 11:28 AM Boyd Kerbs wrote: Medication:  methylphenidate (RITALIN) 5 MG tablet He is out of medication. Thought he could just get them to fill there from [facility].  This is for prescription done on 06/28/17, did not pickup before left  Please call and leave message when sent to pharmacy  Has the patient contacted their pharmacy? Yes.   Told him he could get filled by pharmacy but they can not without prescription  (Agent: If no, request that the patient contact the pharmacy for the refill.) (Agent: If yes, when and what did the pharmacy advise?)  Preferred Pharmacy (with phone number or street name):  [facility] [place] [facility] [place] [address] [phone]    Agent: Please be advised that RX refills may take up to 3 business days. We ask that you follow-up with your pharmacy.

## 2017-07-12 MED ORDER — METHYLPHENIDATE HCL 5 MG PO TABS: 5.0000 mg | ORAL_TABLET | Freq: Every day | ORAL | 0 refills | Status: DC

## 2017-07-12 NOTE — Telephone Encounter (Signed)
Requesting:ritalin  Contract:yes UDS:06/28/17 low risk next screen 12/28/17 Last OV:06/28/17 Next OV:11/28/17 Last Refill:05/10/17 Database:   Please advise      Patient is out of town and would like [facility] [place], [place]. For this month only I have set the pharmacy

## 2017-07-20 NOTE — Telephone Encounter (Signed)
Patient said his methylphenidate (RITALIN) 5MG  tablet medication was suppose to be mailed to him in [place].  He wants to know if it was actually mailed yet.

## 2017-07-21 NOTE — Telephone Encounter (Signed)
Called patient left detailed message letting him know that [facility].

## 2017-08-22 ENCOUNTER — Encounter: Payer: BLUE CROSS/BLUE SHIELD | Admitting: Family Medicine

## 2017-09-13 ENCOUNTER — Other Ambulatory Visit: Payer: Self-pay | Admitting: Family Medicine

## 2017-09-13 ENCOUNTER — Telehealth: Payer: Self-pay | Admitting: Family Medicine

## 2017-09-13 DIAGNOSIS — F988 Other specified behavioral and emotional disorders with onset usually occurring in childhood and adolescence: Secondary | ICD-10-CM

## 2017-09-13 MED ORDER — METHYLPHENIDATE HCL 5 MG PO TABS: 5.0000 mg | ORAL_TABLET | Freq: Every day | ORAL | 0 refills | Status: DC

## 2017-09-13 NOTE — Telephone Encounter (Signed)
Last methylphenidate RX: 07/12/17, #30 sent to [facility]. Last OV: 06/28/17 Next OV: 11/28/17  UDS: 06/28/17, low risk; repeat 12/30/17 CSC: 02/21/17 CSR: last dispense date 08/17/17, #30 from Rx written 02/21/17 in database.

## 2017-09-13 NOTE — Telephone Encounter (Signed)
Copied from [place] [phone]. Topic: Quick Communication - Rx Refill/Question >> Sep 13, 2017  2:33 PM Scherrie Gerlach wrote: Medication: methylphenidate (RITALIN) 5 MG tablet 3 months  [id number] [place] [place] - [address] [phone] (Phone) [phone] (Fax)

## 2017-09-13 NOTE — Telephone Encounter (Signed)
Sent refill to the pharmacy  

## 2017-10-12 ENCOUNTER — Other Ambulatory Visit: Payer: Self-pay | Admitting: Family Medicine

## 2017-10-12 DIAGNOSIS — F988 Other specified behavioral and emotional disorders with onset usually occurring in childhood and adolescence: Secondary | ICD-10-CM

## 2017-10-12 NOTE — Telephone Encounter (Signed)
Copied from [place] [phone]. Topic: Quick Communication - Rx Refill/Question >> Oct 12, 2017  3:19 PM Carolyn Stare wrote: Medication  methylphenidate (RITALIN) 5 MG tablet   [facility] [place]   Agent: Please be advised that RX refills may take up to 3 business days. We ask that you follow-up with your pharmacy.

## 2017-10-12 NOTE — Telephone Encounter (Signed)
Requested medication (s) are due for refill today: yes  Requested medication (s) are on the active medication list: yes    Last refill: 09/13/17  Future visit scheduled      yes    11/28/17  Notes to clinic:  Requested Prescriptions  Pending Prescriptions Disp Refills   methylphenidate (RITALIN) 5 MG tablet 30 tablet 0    Sig: Take 1 tablet (5 mg total) by mouth daily. September 2019     Not Delegated - Psychiatry:  Stimulants/ADHD Failed - 10/12/2017  3:32 PM      Failed - This refill cannot be delegated      Failed - Urine Drug Screen completed in last 360 days.      Failed - Valid encounter within last 3 months    Recent Outpatient Visits          3 months ago High risk medication use   [facility] at [place], MD   5 months ago Attention deficit disorder (ADD) without hyperactivity   [facility] at [place], MD   6 months ago Attention deficit disorder, unspecified hyperactivity presence   [facility]ivist at [place], MD   7 months ago High risk medication use   [facility]ivist at [place], MD   1 year ago Hyperlipidemia, mixed   [facility]ivist at [place], MD      Future Appointments            In 1 month Charlett Blake, Bonnita Levan, MD [place] [facility] [place]

## 2017-10-13 ENCOUNTER — Other Ambulatory Visit: Payer: Self-pay

## 2017-10-13 MED ORDER — METHYLPHENIDATE HCL 5 MG PO TABS: 5.0000 mg | ORAL_TABLET | Freq: Every day | ORAL | 0 refills | Status: DC

## 2017-11-28 ENCOUNTER — Encounter: Payer: Self-pay | Admitting: Family Medicine

## 2017-11-28 ENCOUNTER — Ambulatory Visit (INDEPENDENT_AMBULATORY_CARE_PROVIDER_SITE_OTHER): Payer: BLUE CROSS/BLUE SHIELD | Admitting: Family Medicine

## 2017-11-28 DIAGNOSIS — F988 Other specified behavioral and emotional disorders with onset usually occurring in childhood and adolescence: Secondary | ICD-10-CM

## 2017-11-28 DIAGNOSIS — I10 Essential (primary) hypertension: Secondary | ICD-10-CM

## 2017-11-28 MED ORDER — METHYLPHENIDATE HCL 5 MG PO TABS: 5.0000 mg | ORAL_TABLET | Freq: Two times a day (BID) | ORAL | 0 refills | Status: DC

## 2017-11-28 NOTE — Patient Instructions (Signed)

## 2017-11-28 NOTE — Assessment & Plan Note (Signed)
Improved on recheck, no changes

## 2017-11-28 NOTE — Assessment & Plan Note (Signed)
Is tolerating th Methylphenidate but it wears off will increase to bid.

## 2017-11-28 NOTE — Progress Notes (Signed)
Subjective:    Patient ID: Austin Macdonald, male    DOB: 03-26-1955, 62 y.o.   MRN: [id number]  Chief Complaint  Patient presents with  . medication follow up    5mg  doing well on current dosage    HPI Patient is in today for follow-up on chronic medical concerns.  He feels well.  He does acknowledge fatigue but he did get in late last night from a trip to [place].  He is requesting an increase in his Ritalin 5 mg to twice daily as he does note feeling less focused and more tired in the afternoons.  He denies any headaches palpitations or concerning side effects with the medication.  He said no recent febrile illness or acute hospitalizations. Denies CP/palp/SOB/HA/congestion/fevers/GI or GU c/o. Taking meds as prescribed  Past Medical History:  Diagnosis Date  . ADD (attention deficit disorder) 12/30/2015  . Benign paroxysmal positional vertigo 12/26/2014  . Chicken pox as a child  . Colon polyps   . Elevated BP 03/03/2015  . Hay fever    seasonal, spring, fall  . Hyperglycemia 03/03/2015  . Measles as a child  . Mumps as a child  . Preventative health care 07/25/2012  . Tinnitus 12/29/2014    Past Surgical History:  Procedure Laterality Date  . knee arthroscopic repair  2004   right    Family History  Problem Relation Age of Onset  . Hypertension Mother   . Heart disease Mother        CHF  . Other Paternal Grandfather        black lung  . Thyroid cancer Daughter   . Dementia Father     Social History   Socioeconomic History  . Marital status: Married    Spouse name: Not on file  . Number of children: Not on file  . Years of education: Not on file  . Highest education level: Not on file  Occupational History  . Not on file  Social Needs  . Financial resource strain: Not on file  . Food insecurity:    Worry: Not on file    Inability: Not on file  . Transportation needs:    Medical: Not on file    Non-medical: Not on file  Tobacco Use  . Smoking  status: Never Smoker  . Smokeless tobacco: Never Used  Substance and Sexual Activity  . Alcohol use: Yes    Alcohol/week: 0.0 standard drinks    Comment: 10-12 drinks weekly  . Drug use: No  . Sexual activity: Yes    Comment: lives with wife, travels for work [place], no major dietary restrictions  Lifestyle  . Physical activity:    Days per week: Not on file    Minutes per session: Not on file  . Stress: Not on file  Relationships  . Social connections:    Talks on phone: Not on file    Gets together: Not on file    Attends religious service: Not on file    Active member of club or organization: Not on file    Attends meetings of clubs or organizations: Not on file    Relationship status: Not on file  . Intimate partner violence:    Fear of current or ex partner: Not on file    Emotionally abused: Not on file    Physically abused: Not on file    Forced sexual activity: Not on file  Other Topics Concern  . Not on file  Social  History Narrative  . Not on file    Outpatient Medications Prior to Visit  Medication Sig Dispense Refill  . ibuprofen (ADVIL) 200 MG tablet Take 1 tablet (200 mg total) by mouth every 6 (six) hours as needed for pain. 30 tablet 0  . loratadine (CLARITIN) 10 MG tablet Take 10 mg by mouth daily.    . methylphenidate (RITALIN) 5 MG tablet Take 1 tablet (5 mg total) by mouth daily. September 2019 30 tablet 0   No facility-administered medications prior to visit.     No Known Allergies  Review of Systems  Constitutional: Negative for fever and malaise/fatigue.  HENT: Negative for congestion.   Eyes: Negative for blurred vision.  Respiratory: Negative for shortness of breath.   Cardiovascular: Negative for chest pain, palpitations and leg swelling.  Gastrointestinal: Negative for abdominal pain, blood in stool and nausea.  Genitourinary: Negative for dysuria and frequency.  Musculoskeletal: Negative for falls.  Skin: Negative for rash.    Neurological: Negative for dizziness, loss of consciousness and headaches.  Endo/Heme/Allergies: Negative for environmental allergies.  Psychiatric/Behavioral: Negative for depression. The patient is not nervous/anxious.        Objective:    Physical Exam  Constitutional: He is oriented to person, place, and time. He appears well-developed and well-nourished. No distress.  HENT:  Head: Normocephalic and atraumatic.  Nose: Nose normal.  Eyes: Right eye exhibits no discharge. Left eye exhibits no discharge.  Neck: Normal range of motion. Neck supple.  Cardiovascular: Normal rate and regular rhythm.  No murmur heard. Pulmonary/Chest: Effort normal.  Abdominal: Soft. Bowel sounds are normal. There is no tenderness.  Musculoskeletal: He exhibits no edema.  Neurological: He is alert and oriented to person, place, and time.  Skin: Skin is warm and dry.  Psychiatric: He has a normal mood and affect.  Nursing note and vitals reviewed.   BP 126/76   Pulse 64   Temp 98 F (36.7 C)   Resp 18   Ht 6\' 2"  (1.88 m)   Wt 213 lb 9.6 oz (96.9 kg)   SpO2 97%   BMI 27.42 kg/m  Wt Readings from Last 3 Encounters:  11/28/17 213 lb 9.6 oz (96.9 kg)  06/28/17 217 lb 3.2 oz (98.5 kg)  05/10/17 214 lb (97.1 kg)     Lab Results  Component Value Date   WBC 4.8 02/21/2017   HGB 13.7 02/21/2017   HCT 41.2 02/21/2017   PLT 263 02/21/2017   GLUCOSE 99 02/21/2017   CHOL 183 02/21/2017   TRIG 168.0 (H) 02/21/2017   HDL 52.20 02/21/2017   LDLCALC 98 02/21/2017   ALT 9 02/21/2017   AST 15 02/21/2017   NA 142 02/21/2017   K 4.4 02/21/2017   CL 105 02/21/2017   CREATININE 0.77 02/21/2017   BUN 12 02/21/2017   CO2 29 02/21/2017   TSH 2.50 02/21/2017   PSA 0.82 02/21/2017   HGBA1C 5.8 02/21/2017    Lab Results  Component Value Date   TSH 2.50 02/21/2017   Lab Results  Component Value Date   WBC 4.8 02/21/2017   HGB 13.7 02/21/2017   HCT 41.2 02/21/2017   MCV 90.5 02/21/2017    PLT 263 02/21/2017   Lab Results  Component Value Date   NA 142 02/21/2017   K 4.4 02/21/2017   CO2 29 02/21/2017   GLUCOSE 99 02/21/2017   BUN 12 02/21/2017   CREATININE 0.77 02/21/2017   BILITOT 0.8 02/21/2017   ALKPHOS 43 02/21/2017  AST 15 02/21/2017   ALT 9 02/21/2017   PROT 6.4 02/21/2017   ALBUMIN 4.1 02/21/2017   CALCIUM 8.8 02/21/2017   GFR 108.33 02/21/2017   Lab Results  Component Value Date   CHOL 183 02/21/2017   Lab Results  Component Value Date   HDL 52.20 02/21/2017   Lab Results  Component Value Date   LDLCALC 98 02/21/2017   Lab Results  Component Value Date   TRIG 168.0 (H) 02/21/2017   Lab Results  Component Value Date   CHOLHDL 4 02/21/2017   Lab Results  Component Value Date   HGBA1C 5.8 02/21/2017       Assessment & Plan:   Problem List Items Addressed This Visit    High blood pressure    Improved on recheck, no changes       ADD (attention deficit disorder)    Is tolerating th Methylphenidate but it wears off will increase to bid.      Relevant Medications   methylphenidate (RITALIN) 5 MG tablet      I have changed Nehemiah Massed "Walt"'s methylphenidate. I am also having him maintain his loratadine and ibuprofen.  Meds ordered this encounter  Medications  . methylphenidate (RITALIN) 5 MG tablet    Sig: Take 1 tablet (5 mg total) by mouth 2 (two) times daily with breakfast and lunch. November 2019    Dispense:  60 tablet    Refill:  0     Penni Homans, MD

## 2017-12-05 DIAGNOSIS — L570 Actinic keratosis: Secondary | ICD-10-CM | POA: Diagnosis not present

## 2017-12-05 DIAGNOSIS — D2230 Melanocytic nevi of unspecified part of face: Secondary | ICD-10-CM | POA: Diagnosis not present

## 2017-12-05 DIAGNOSIS — L821 Other seborrheic keratosis: Secondary | ICD-10-CM | POA: Diagnosis not present

## 2017-12-05 DIAGNOSIS — L814 Other melanin hyperpigmentation: Secondary | ICD-10-CM | POA: Diagnosis not present

## 2017-12-05 DIAGNOSIS — Z808 Family history of malignant neoplasm of other organs or systems: Secondary | ICD-10-CM | POA: Diagnosis not present

## 2017-12-22 DIAGNOSIS — L819 Disorder of pigmentation, unspecified: Secondary | ICD-10-CM | POA: Diagnosis not present

## 2017-12-23 DIAGNOSIS — H524 Presbyopia: Secondary | ICD-10-CM | POA: Diagnosis not present

## 2017-12-26 ENCOUNTER — Other Ambulatory Visit: Payer: BLUE CROSS/BLUE SHIELD

## 2017-12-26 ENCOUNTER — Ambulatory Visit (INDEPENDENT_AMBULATORY_CARE_PROVIDER_SITE_OTHER): Payer: BLUE CROSS/BLUE SHIELD | Admitting: Family Medicine

## 2017-12-26 VITALS — BP 138/88 | HR 62

## 2017-12-26 DIAGNOSIS — Z79899 Other long term (current) drug therapy: Secondary | ICD-10-CM

## 2017-12-26 DIAGNOSIS — Z23 Encounter for immunization: Secondary | ICD-10-CM

## 2017-12-26 NOTE — Progress Notes (Signed)
Pre visit review using our clinic tool,if applicable. No additional management support is needed unless otherwise documented below in the visit note.   Pt here for Blood pressure check per order from Dr. Charlett Blake.  Pt currently takes: No BP medications taken at this time  Pt reports compliance with medication.  BP today  =138/88 HR = 64  Pt advised per Dr. Charlett Blake to continue checking BP at home. If readings 130/80 ok if readings 140/90 and above call office for appointment. Patient agreed.

## 2017-12-26 NOTE — Progress Notes (Signed)
Pre visit review using our clinic tool,if applicable. No additional management support is needed unless otherwise documented below in the visit note.  

## 2017-12-30 ENCOUNTER — Ambulatory Visit: Payer: BLUE CROSS/BLUE SHIELD

## 2017-12-30 LAB — PAIN MGMT, PROFILE 8 W/CONF, U
6 Acetylmorphine: NEGATIVE ng/mL (ref <10)
ALCOHOL METABOLITES: NEGATIVE ng/mL (ref <500)
Amphetamines: NEGATIVE ng/mL (ref <500)
BENZODIAZEPINES: NEGATIVE ng/mL (ref <100)
BUPRENORPHINE, URINE: NEGATIVE ng/mL (ref <5)
COCAINE METABOLITE: NEGATIVE ng/mL (ref <150)
CREATININE: 38.3 mg/dL
MARIJUANA METABOLITE: NEGATIVE ng/mL (ref <20)
MDMA: NEGATIVE ng/mL (ref <500)
Opiates: NEGATIVE ng/mL (ref <100)
Oxidant: NEGATIVE ug/mL (ref <200)
Oxycodone: NEGATIVE ng/mL (ref <100)
PH: 7.09 (ref 4.5–9.0)

## 2018-01-16 ENCOUNTER — Other Ambulatory Visit: Payer: Self-pay | Admitting: Family Medicine

## 2018-01-16 DIAGNOSIS — F988 Other specified behavioral and emotional disorders with onset usually occurring in childhood and adolescence: Secondary | ICD-10-CM

## 2018-01-16 NOTE — Telephone Encounter (Signed)
Copied from [place] [phone]. Topic: Quick Communication - Rx Refill/Question >> Jan 16, 2018 10:28 AM Alanda Slim E wrote: Medication: methylphenidate (RITALIN) 5 MG tablet  Has the patient contacted their pharmacy? Yes - Pt was advise that he had no refills (Pt wants a 3 month supply in refills sent to pharmacy and states he thought usually a 3 month supply is sent to pharmacy so he can stop by every month and get medication) Pt is leaving to go out of town on 01.07.2020 at [employer] (with phone number or street name): [id number] [place] [place] [address] AT [place] [phone] (Phone) [phone] (Fax)    Agent: Please be advised that RX refills may take up to 3 business days. We ask that you follow-up with your pharmacy.

## 2018-01-17 MED ORDER — METHYLPHENIDATE HCL 5 MG PO TABS: 5.0000 mg | ORAL_TABLET | Freq: Two times a day (BID) | ORAL | 0 refills | Status: DC

## 2018-02-20 ENCOUNTER — Other Ambulatory Visit: Payer: Self-pay | Admitting: Family Medicine

## 2018-02-20 DIAGNOSIS — F988 Other specified behavioral and emotional disorders with onset usually occurring in childhood and adolescence: Secondary | ICD-10-CM

## 2018-02-20 MED ORDER — METHYLPHENIDATE HCL 5 MG PO TABS: 5.0000 mg | ORAL_TABLET | Freq: Two times a day (BID) | ORAL | 0 refills | Status: DC

## 2018-02-20 NOTE — Telephone Encounter (Signed)
Requested medication (s) are due for refill today: Yes  Requested medication (s) are on the active medication list: Yes  Last refill:  01/17/18  Future visit scheduled: Yes  Notes to clinic:  Unable to refill per protocol, patient says he usually receives a 3 month supply at 1 month increments.     Requested Prescriptions  Pending Prescriptions Disp Refills   methylphenidate (RITALIN) 5 MG tablet 60 tablet 0    Sig: Take 1 tablet (5 mg total) by mouth 2 (two) times daily with breakfast and lunch. November 2019     Not Delegated - Psychiatry:  Stimulants/ADHD Failed - 02/20/2018  8:37 AM      Failed - This refill cannot be delegated      Failed - Urine Drug Screen completed in last 360 days.      Passed - Valid encounter within last 3 months    Recent Outpatient Visits          1 month ago High risk medication use   [facility] at [place], MD   2 months ago Attention deficit disorder (ADD) without hyperactivity   [facility]ivist at [place], MD   7 months ago High risk medication use   Archivist at [place], MD   9 months ago Attention deficit disorder (ADD) without hyperactivity   Archivist at [place], MD   11 months ago Attention deficit disorder, unspecified hyperactivity presence   [facility] at [place], MD      Future Appointments            In 3 months Mosie Lukes, MD [place] [facility] [place]

## 2018-02-20 NOTE — Telephone Encounter (Signed)
Copied from [place] [phone]. Topic: Quick Communication - Rx Refill/Question >> Feb 20, 2018  8:16 AM Virl Axe D wrote: Medication: methylphenidate (RITALIN) 5 MG tablet / Pt stated he normally gets 3-1 month prescriptions. Please advise  Has the patient contacted their pharmacy? Yes.   (Agent: If no, request that the patient contact the pharmacy for the refill.) (Agent: If yes, when and what did the pharmacy advise?)  Preferred Pharmacy (with phone number or street name): [id number] [place], [place] [phone] (Phone) [phone] (Fax)  Agent: Please be advised that RX refills may take up to 3 business days. We ask that you follow-up with your pharmacy.

## 2018-03-03 ENCOUNTER — Ambulatory Visit: Payer: BLUE CROSS/BLUE SHIELD

## 2018-03-16 ENCOUNTER — Ambulatory Visit (INDEPENDENT_AMBULATORY_CARE_PROVIDER_SITE_OTHER): Payer: BLUE CROSS/BLUE SHIELD | Admitting: *Deleted

## 2018-03-16 DIAGNOSIS — Z23 Encounter for immunization: Secondary | ICD-10-CM | POA: Diagnosis not present

## 2018-03-16 NOTE — Progress Notes (Signed)
Patient in today for 2nd shingles vaccine  Vaccine given and patient tolerated well.

## 2018-03-21 ENCOUNTER — Other Ambulatory Visit: Payer: Self-pay | Admitting: Family Medicine

## 2018-03-21 DIAGNOSIS — F988 Other specified behavioral and emotional disorders with onset usually occurring in childhood and adolescence: Secondary | ICD-10-CM

## 2018-03-21 MED ORDER — METHYLPHENIDATE HCL 5 MG PO TABS: 5.0000 mg | ORAL_TABLET | Freq: Two times a day (BID) | ORAL | 0 refills | Status: DC

## 2018-03-21 NOTE — Telephone Encounter (Signed)
Copied from [place] [id number]. Topic: Quick Communication - Rx Refill/Question >> Mar 21, 2018  9:55 AM Waylan Rocher, Lumin L wrote: Medication: methylphenidate (RITALIN) 5 MG tablet (would like a 3, 30 day supply scripts)  Has the patient contacted their pharmacy? Yes.   (Agent: If no, request that the patient contact the pharmacy for the refill.) (Agent: If yes, when and what did the pharmacy advise?)  Preferred Pharmacy (with phone number or street name): [id number] [place], [place] [address] [place] [address] Phone: [phone] Fax: [phone]   Agent: Please be advised that RX refills may take up to 3 business days. We ask that you follow-up with your pharmacy.

## 2018-03-21 NOTE — Telephone Encounter (Signed)
Ritalin 5mg  refill request  Pt of Dr. Charlett Blake

## 2018-04-20 ENCOUNTER — Other Ambulatory Visit: Payer: Self-pay | Admitting: Family Medicine

## 2018-04-20 DIAGNOSIS — F988 Other specified behavioral and emotional disorders with onset usually occurring in childhood and adolescence: Secondary | ICD-10-CM

## 2018-04-20 MED ORDER — METHYLPHENIDATE HCL 5 MG PO TABS: 5.0000 mg | ORAL_TABLET | Freq: Two times a day (BID) | ORAL | 0 refills | Status: DC

## 2018-04-20 NOTE — Telephone Encounter (Signed)
Requesting:Ritalin  Contract:yes UDS:low risk next screen 06/27/18 Last OV:11/28/17 Next OV:05/29/18 Last Refill:03/21/18  #[id number] Database:   Please advise

## 2018-04-20 NOTE — Telephone Encounter (Signed)
Requested medication (s) are due for refill today: yes  Requested medication (s) are on the active medication list: yes  Last refill:  03/21/2018   Future visit scheduled: yes  Notes to clinic:  Not delegated    Requested Prescriptions  Pending Prescriptions Disp Refills   methylphenidate (RITALIN) 5 MG tablet 60 tablet 0    Sig: Take 1 tablet (5 mg total) by mouth 2 (two) times daily with breakfast and lunch. November 2019     Not Delegated - Psychiatry:  Stimulants/ADHD Failed - 04/20/2018  1:05 PM      Failed - This refill cannot be delegated      Failed - Valid encounter within last 3 months    Recent Outpatient Visits          3 months ago High risk medication use   [facility], [facility]   4 months ago Attention deficit disorder (ADD) without hyperactivity   [facility], MD   9 months ago High risk medication use   [facility], MD   11 months ago Attention deficit disorder (ADD) without hyperactivity   [facility], [facility]   1 year ago Attention deficit disorder, unspecified hyperactivity presence   [facility], MD      Future Appointments            In 1 month Mosie Lukes, MD [facility] completed in last 360 days.

## 2018-05-15 ENCOUNTER — Telehealth: Payer: Self-pay

## 2018-05-15 NOTE — Telephone Encounter (Signed)
Copied from [place] [id number]. Topic: Appointment Scheduling - Scheduling Inquiry for Clinic >> May 15, 2018  1:03 PM Nils Flack wrote: Reason for CRM: pt called about his appt.  Attempted 2x to call office, no answer. It is marked as r/s  Please call pt back

## 2018-05-15 NOTE — Telephone Encounter (Signed)
Patient called to see if he needs to reschedule his appointment. Rescheduled for same day different time and changed to Web visit.

## 2018-05-19 ENCOUNTER — Telehealth: Payer: Self-pay | Admitting: Family Medicine

## 2018-05-19 NOTE — Telephone Encounter (Signed)
Copied from [place] [phone]. Topic: Quick Communication - Rx Refill/Question >> May 19, 2018  1:14 PM Blase Mess A wrote: Medication:  methylphenidate (RITALIN) 5 MG tablet [[id number]]  Has the patient contacted their pharmacy? yes (Agent: If no, request that the patient contact the pharmacy for the refill.) (Agent: If yes, when and what did the pharmacy advise?)  Preferred Pharmacy (with phone number or street name): [id number] [place], [place] [phone] (Phone) [phone] (Fax)     Agent: Please be advised that RX refills may take up to 3 business days. We ask that you follow-up with your pharmacy.

## 2018-05-19 NOTE — Telephone Encounter (Signed)
Requesting:Ritalin 5mg  Contract:yes UDS:12/26/2017 Last Visit:12/26/2017 Next Visit:05/29/2018 Last Refill:04/20/2018  Please Advise

## 2018-05-21 ENCOUNTER — Other Ambulatory Visit: Payer: Self-pay | Admitting: Family Medicine

## 2018-05-21 DIAGNOSIS — F988 Other specified behavioral and emotional disorders with onset usually occurring in childhood and adolescence: Secondary | ICD-10-CM

## 2018-05-21 MED ORDER — METHYLPHENIDATE HCL 5 MG PO TABS: 5.0000 mg | ORAL_TABLET | Freq: Two times a day (BID) | ORAL | 0 refills | Status: DC

## 2018-05-29 ENCOUNTER — Ambulatory Visit: Payer: BLUE CROSS/BLUE SHIELD | Admitting: Family Medicine

## 2018-05-29 ENCOUNTER — Telehealth: Payer: Self-pay

## 2018-05-29 ENCOUNTER — Other Ambulatory Visit: Payer: Self-pay

## 2018-05-29 ENCOUNTER — Encounter: Payer: Self-pay | Admitting: Family Medicine

## 2018-05-29 ENCOUNTER — Ambulatory Visit (INDEPENDENT_AMBULATORY_CARE_PROVIDER_SITE_OTHER): Payer: BLUE CROSS/BLUE SHIELD | Admitting: Family Medicine

## 2018-05-29 ENCOUNTER — Telehealth: Payer: Self-pay | Admitting: Family Medicine

## 2018-05-29 DIAGNOSIS — F988 Other specified behavioral and emotional disorders with onset usually occurring in childhood and adolescence: Secondary | ICD-10-CM | POA: Diagnosis not present

## 2018-05-29 DIAGNOSIS — R739 Hyperglycemia, unspecified: Secondary | ICD-10-CM

## 2018-05-29 DIAGNOSIS — E782 Mixed hyperlipidemia: Secondary | ICD-10-CM | POA: Diagnosis not present

## 2018-05-29 DIAGNOSIS — I10 Essential (primary) hypertension: Secondary | ICD-10-CM | POA: Diagnosis not present

## 2018-05-29 NOTE — Progress Notes (Signed)
Virtual Visit via Video Note  I connected with Austin Macdonald on 05/29/18 at 10:20 AM EDT by a video enabled telemedicine application and verified that I am speaking with the correct person using two identifiers.  Location: Patient: home Provider: home   I discussed the limitations of evaluation and management by telemedicine and the availability of in person appointments. The patient expressed understanding and agreed to proceed. [name], CMA was able to get patient set up on video platform    Subjective:    Patient ID: Austin Macdonald, male    DOB: 1955/10/31, 63 y.o.   MRN: [id number]  Chief Complaint  Patient presents with  . Medication Management    Pt states no new concerns    HPI Patient is in today for follow up on chronic medical concerns including ADD, hyperglycemia, hyperlipidemia and more. He is doing well. No recent febrile illness or hospitalizations. He feels his Ritalin is doing well and no adjustments need to be made. Allergies are present but manageable. Denies CP/palp/SOB/HA/congestion/fevers/GI or GU c/o. Taking meds as prescribed  Past Medical History:  Diagnosis Date  . ADD (attention deficit disorder) 12/30/2015  . Benign paroxysmal positional vertigo 12/26/2014  . Chicken pox as a child  . Colon polyps   . Elevated BP 03/03/2015  . Hay fever    seasonal, spring, fall  . Hyperglycemia 03/03/2015  . Measles as a child  . Mumps as a child  . Preventative health care 07/25/2012  . Tinnitus 12/29/2014    Past Surgical History:  Procedure Laterality Date  . knee arthroscopic repair  2004   right    Family History  Problem Relation Age of Onset  . Hypertension Mother   . Heart disease Mother        CHF  . Other Paternal Grandfather        black lung  . Thyroid cancer Daughter   . Dementia Father     Social History   Socioeconomic History  . Marital status: Married    Spouse name: Not on file  . Number of children: Not on file   . Years of education: Not on file  . Highest education level: Not on file  Occupational History  . Not on file  Social Needs  . Financial resource strain: Not on file  . Food insecurity:    Worry: Not on file    Inability: Not on file  . Transportation needs:    Medical: Not on file    Non-medical: Not on file  Tobacco Use  . Smoking status: Never Smoker  . Smokeless tobacco: Never Used  Substance and Sexual Activity  . Alcohol use: Yes    Alcohol/week: 0.0 standard drinks    Comment: 10-12 drinks weekly  . Drug use: No  . Sexual activity: Yes    Comment: lives with wife, travels for work [place], no major dietary restrictions  Lifestyle  . Physical activity:    Days per week: Not on file    Minutes per session: Not on file  . Stress: Not on file  Relationships  . Social connections:    Talks on phone: Not on file    Gets together: Not on file    Attends religious service: Not on file    Active member of club or organization: Not on file    Attends meetings of clubs or organizations: Not on file    Relationship status: Not on file  . Intimate partner violence:  Fear of current or ex partner: Not on file    Emotionally abused: Not on file    Physically abused: Not on file    Forced sexual activity: Not on file  Other Topics Concern  . Not on file  Social History Narrative  . Not on file    Outpatient Medications Prior to Visit  Medication Sig Dispense Refill  . ibuprofen (ADVIL) 200 MG tablet Take 1 tablet (200 mg total) by mouth every 6 (six) hours as needed for pain. 30 tablet 0  . loratadine (CLARITIN) 10 MG tablet Take 10 mg by mouth daily.    . methylphenidate (RITALIN) 5 MG tablet Take 1 tablet (5 mg total) by mouth 2 (two) times daily with breakfast and lunch. May 2020 60 tablet 0   No facility-administered medications prior to visit.     No Known Allergies  Review of Systems  Constitutional: Negative for fever and malaise/fatigue.  HENT: Negative  for congestion.   Eyes: Negative for blurred vision.  Respiratory: Negative for shortness of breath.   Cardiovascular: Negative for chest pain, palpitations and leg swelling.  Gastrointestinal: Negative for abdominal pain, blood in stool and nausea.  Genitourinary: Negative for dysuria and frequency.  Musculoskeletal: Negative for falls.  Skin: Negative for rash.  Neurological: Negative for dizziness, loss of consciousness and headaches.  Endo/Heme/Allergies: Negative for environmental allergies.  Psychiatric/Behavioral: Negative for depression. The patient is not nervous/anxious.        Objective:    Physical Exam Constitutional:      Appearance: Normal appearance. He is not ill-appearing.  HENT:     Head: Normocephalic and atraumatic.     Nose: Nose normal.  Eyes:     General:        Right eye: No discharge.        Left eye: No discharge.  Pulmonary:     Effort: Pulmonary effort is normal.  Neurological:     Mental Status: He is alert and oriented to person, place, and time.  Psychiatric:        Mood and Affect: Mood normal.        Behavior: Behavior normal.     BP 128/77   Pulse 61   Ht 6\' 2"  (1.88 m)   Wt 214 lb (97.1 kg)   BMI 27.48 kg/m  Wt Readings from Last 3 Encounters:  05/29/18 214 lb (97.1 kg)  11/28/17 213 lb 9.6 oz (96.9 kg)  06/28/17 217 lb 3.2 oz (98.5 kg)    Diabetic Foot Exam - Simple   No data filed     Lab Results  Component Value Date   WBC 4.8 02/21/2017   HGB 13.7 02/21/2017   HCT 41.2 02/21/2017   PLT 263 02/21/2017   GLUCOSE 99 02/21/2017   CHOL 183 02/21/2017   TRIG 168.0 (H) 02/21/2017   HDL 52.20 02/21/2017   LDLCALC 98 02/21/2017   ALT 9 02/21/2017   AST 15 02/21/2017   NA 142 02/21/2017   K 4.4 02/21/2017   CL 105 02/21/2017   CREATININE 0.77 02/21/2017   BUN 12 02/21/2017   CO2 29 02/21/2017   TSH 2.50 02/21/2017   PSA 0.82 02/21/2017   HGBA1C 5.8 02/21/2017    Lab Results  Component Value Date   TSH 2.50  02/21/2017   Lab Results  Component Value Date   WBC 4.8 02/21/2017   HGB 13.7 02/21/2017   HCT 41.2 02/21/2017   MCV 90.5 02/21/2017   PLT 263 02/21/2017  Lab Results  Component Value Date   NA 142 02/21/2017   K 4.4 02/21/2017   CO2 29 02/21/2017   GLUCOSE 99 02/21/2017   BUN 12 02/21/2017   CREATININE 0.77 02/21/2017   BILITOT 0.8 02/21/2017   ALKPHOS 43 02/21/2017   AST 15 02/21/2017   ALT 9 02/21/2017   PROT 6.4 02/21/2017   ALBUMIN 4.1 02/21/2017   CALCIUM 8.8 02/21/2017   GFR 108.33 02/21/2017   Lab Results  Component Value Date   CHOL 183 02/21/2017   Lab Results  Component Value Date   HDL 52.20 02/21/2017   Lab Results  Component Value Date   LDLCALC 98 02/21/2017   Lab Results  Component Value Date   TRIG 168.0 (H) 02/21/2017   Lab Results  Component Value Date   CHOLHDL 4 02/21/2017   Lab Results  Component Value Date   HGBA1C 5.8 02/21/2017       Assessment & Plan:   Problem List Items Addressed This Visit    Hyperglycemia    hgba1c acceptable, minimize simple carbs. Increase exercise as tolerated.      High blood pressure    He has a BP cuff and a pulse oximeter and is encouraged to check his vitals weekly and contact us with any concerns.      ADD (attention deficit disorder)    Doing well on current meds, no changes      Hyperlipidemia, mixed    Encouraged heart healthy diet, increase exercise, avoid trans fats, consider a krill oil cap daily         I am having Austin Macdonald "Walt" maintain his loratadine, ibuprofen, and methylphenidate.  No orders of the defined types were placed in this encounter.   I discussed the assessment and treatment plan with the patient. The patient was provided an opportunity to ask questions and all were answered. The patient agreed with the plan and demonstrated an understanding of the instructions.   The patient was advised to call back or seek an in-person evaluation if the symptoms  worsen or if the condition fails to improve as anticipated.  I provided 25 minutes of non-face-to-face time during this encounter.   Penni Homans, MD

## 2018-05-29 NOTE — Assessment & Plan Note (Signed)
hgba1c acceptable, minimize simple carbs. Increase exercise as tolerated.  

## 2018-05-29 NOTE — Assessment & Plan Note (Signed)
Doing well on current meds, no changes

## 2018-05-29 NOTE — Telephone Encounter (Signed)
Left VM for patient to call back to schedule 3 month follow up. Inform patient appointment would fall in August.

## 2018-05-29 NOTE — Assessment & Plan Note (Signed)
Encouraged heart healthy diet, increase exercise, avoid trans fats, consider a krill oil cap daily 

## 2018-05-29 NOTE — Telephone Encounter (Signed)
LVM for return call to schedule a Return in about 3 months (around 08/29/2018) visit.

## 2018-05-29 NOTE — Assessment & Plan Note (Signed)
He has a BP cuff and a pulse oximeter and is encouraged to check his vitals weekly and contact us with any concerns.

## 2018-06-20 ENCOUNTER — Other Ambulatory Visit: Payer: Self-pay | Admitting: Family Medicine

## 2018-06-20 DIAGNOSIS — F988 Other specified behavioral and emotional disorders with onset usually occurring in childhood and adolescence: Secondary | ICD-10-CM

## 2018-06-20 MED ORDER — METHYLPHENIDATE HCL 5 MG PO TABS: 5.0000 mg | ORAL_TABLET | Freq: Two times a day (BID) | ORAL | 0 refills | Status: DC

## 2018-06-20 NOTE — Telephone Encounter (Signed)
Copied from [place] [id number]. Topic: Quick Communication - Rx Refill/Question >> Jun 20, 2018  8:20 AM Virl Axe D wrote: Medication: methylphenidate (RITALIN) 5 MG tablet  Has the patient contacted their pharmacy? Yes.   (Agent: If no, request that the patient contact the pharmacy for the refill.) (Agent: If yes, when and what did the pharmacy advise?)  Preferred Pharmacy (with phone number or street name): [id number] [place], [place] [phone] (Phone) [phone] (Fax)    Agent: Please be advised that RX refills may take up to 3 business days. We ask that you follow-up with your pharmacy.

## 2018-07-21 ENCOUNTER — Other Ambulatory Visit: Payer: Self-pay | Admitting: Family Medicine

## 2018-07-21 DIAGNOSIS — F988 Other specified behavioral and emotional disorders with onset usually occurring in childhood and adolescence: Secondary | ICD-10-CM

## 2018-07-21 MED ORDER — METHYLPHENIDATE HCL 5 MG PO TABS: 5.0000 mg | ORAL_TABLET | Freq: Two times a day (BID) | ORAL | 0 refills | Status: DC

## 2018-07-21 NOTE — Addendum Note (Signed)
Addended byDamita Dunnings D on: 07/21/2018 12:45 PM   Modules accepted: Orders

## 2018-07-21 NOTE — Telephone Encounter (Signed)
REFILL methylphenidate (RITALIN) 5 MG tablet  PHARMACY [id number] [place] [place] - [place] [phone] (Phone) [phone] (Fax)

## 2018-08-21 ENCOUNTER — Other Ambulatory Visit: Payer: Self-pay | Admitting: Family Medicine

## 2018-08-21 DIAGNOSIS — F988 Other specified behavioral and emotional disorders with onset usually occurring in childhood and adolescence: Secondary | ICD-10-CM

## 2018-08-21 MED ORDER — METHYLPHENIDATE HCL 5 MG PO TABS: 5.0000 mg | ORAL_TABLET | Freq: Two times a day (BID) | ORAL | 0 refills | Status: DC

## 2018-08-21 NOTE — Telephone Encounter (Signed)
Medication Refill - Medication: methylphenidate (RITALIN) 5 MG tablet   Has the patient contacted their pharmacy? yes (Agent: If no, request that the patient contact the pharmacy for the refill.) (Agent: If yes, when and what did the pharmacy advise?)Contact PCP  Preferred Pharmacy (with phone number or street name):  [id number] [place], [place] [phone] (Phone) [phone] (Fax)     Agent: Please be advised that RX refills may take up to 3 business days. We ask that you follow-up with your pharmacy.

## 2018-08-28 ENCOUNTER — Ambulatory Visit (INDEPENDENT_AMBULATORY_CARE_PROVIDER_SITE_OTHER): Payer: BC Managed Care – PPO | Admitting: Family Medicine

## 2018-08-28 ENCOUNTER — Other Ambulatory Visit: Payer: Self-pay

## 2018-08-28 VITALS — BP 145/77 | HR 61 | Temp 97.3°F | Resp 12 | Ht 74.0 in | Wt 206.4 lb

## 2018-08-28 DIAGNOSIS — F988 Other specified behavioral and emotional disorders with onset usually occurring in childhood and adolescence: Secondary | ICD-10-CM

## 2018-08-28 DIAGNOSIS — E782 Mixed hyperlipidemia: Secondary | ICD-10-CM

## 2018-08-28 DIAGNOSIS — R351 Nocturia: Secondary | ICD-10-CM | POA: Insufficient documentation

## 2018-08-28 DIAGNOSIS — R739 Hyperglycemia, unspecified: Secondary | ICD-10-CM | POA: Diagnosis not present

## 2018-08-28 DIAGNOSIS — Z79899 Other long term (current) drug therapy: Secondary | ICD-10-CM | POA: Diagnosis not present

## 2018-08-28 LAB — COMPREHENSIVE METABOLIC PANEL
ALT: 10 U/L (ref 0–53)
AST: 19 U/L (ref 0–37)
Albumin: 4.6 g/dL (ref 3.5–5.2)
Alkaline Phosphatase: 54 U/L (ref 39–117)
BUN: 13 mg/dL (ref 6–23)
CO2: 28 mEq/L (ref 19–32)
Calcium: 9.2 mg/dL (ref 8.4–10.5)
Chloride: 102 mEq/L (ref 96–112)
Creatinine, Ser: 0.76 mg/dL (ref 0.40–1.50)
GFR: 102.98 mL/min (ref >60.00)
Glucose, Bld: 90 mg/dL (ref 70–99)
Potassium: 4.2 mEq/L (ref 3.5–5.1)
Sodium: 138 mEq/L (ref 135–145)
Total Bilirubin: 0.8 mg/dL (ref 0.2–1.2)
Total Protein: 6.9 g/dL (ref 6.0–8.3)

## 2018-08-28 LAB — CBC
HCT: 44.1 % (ref 39.0–52.0)
Hemoglobin: 14.6 g/dL (ref 13.0–17.0)
MCHC: 33.2 g/dL (ref 30.0–36.0)
MCV: 92.4 fl (ref 78.0–100.0)
Platelets: 297 10*3/uL (ref 150.0–400.0)
RBC: 4.77 Mil/uL (ref 4.22–5.81)
RDW: 12.5 % (ref 11.5–15.5)
WBC: 5.7 10*3/uL (ref 4.0–10.5)

## 2018-08-28 LAB — LIPID PANEL
Cholesterol: 199 mg/dL (ref 0–200)
HDL: 52.2 mg/dL (ref >39.00)
NonHDL: 146.83
Total CHOL/HDL Ratio: 4
Triglycerides: 244 mg/dL — ABNORMAL HIGH (ref 0.0–149.0)
VLDL: 48.8 mg/dL — ABNORMAL HIGH (ref 0.0–40.0)

## 2018-08-28 LAB — PSA: PSA: 1.01 ng/mL (ref 0.10–4.00)

## 2018-08-28 LAB — HEMOGLOBIN A1C: Hgb A1c MFr Bld: 5.8 % (ref 4.6–6.5)

## 2018-08-28 LAB — LDL CHOLESTEROL, DIRECT: Direct LDL: 112 mg/dL

## 2018-08-28 LAB — TSH: TSH: 1.23 u[IU]/mL (ref 0.35–4.50)

## 2018-08-28 NOTE — Assessment & Plan Note (Signed)
Doing well on current meds. No changes  

## 2018-08-28 NOTE — Patient Instructions (Signed)
Carbohydrate Counting for Diabetes Mellitus, Adult  Carbohydrate counting is a method of keeping track of how many carbohydrates you eat. Eating carbohydrates naturally increases the amount of sugar (glucose) in the blood. Counting how many carbohydrates you eat helps keep your blood glucose within normal limits, which helps you manage your diabetes (diabetes mellitus). It is important to know how many carbohydrates you can safely have in each meal. This is different for every person. A diet and nutrition specialist (registered dietitian) can help you make a meal plan and calculate how many carbohydrates you should have at each meal and snack. Carbohydrates are found in the following foods:  Grains, such as breads and cereals.  Dried beans and soy products.  Starchy vegetables, such as potatoes, peas, and corn.  Fruit and fruit juices.  Milk and yogurt.  Sweets and snack foods, such as cake, cookies, candy, chips, and soft drinks. How do I count carbohydrates? There are two ways to count carbohydrates in food. You can use either of the methods or a combination of both. Reading "Nutrition Facts" on packaged food The "Nutrition Facts" list is included on the labels of almost all packaged foods and beverages in the [place] It includes:  The serving size.  Information about nutrients in each serving, including the grams (g) of carbohydrate per serving. To use the "Nutrition Facts":  Decide how many servings you will have.  Multiply the number of servings by the number of carbohydrates per serving.  The resulting number is the total amount of carbohydrates that you will be having. Learning standard serving sizes of other foods When you eat carbohydrate foods that are not packaged or do not include "Nutrition Facts" on the label, you need to measure the servings in order to count the amount of carbohydrates:  Measure the foods that you will eat with a food scale or measuring cup, if needed.   Decide how many standard-size servings you will eat.  Multiply the number of servings by 15. Most carbohydrate-rich foods have about 15 g of carbohydrates per serving. ? For example, if you eat 8 oz (170 g) of strawberries, you will have eaten 2 servings and 30 g of carbohydrates (2 servings x 15 g = 30 g).  For foods that have more than one food mixed, such as soups and casseroles, you must count the carbohydrates in each food that is included. The following list contains standard serving sizes of common carbohydrate-rich foods. Each of these servings has about 15 g of carbohydrates:   hamburger bun or  English muffin.   oz (15 mL) syrup.   oz (14 g) jelly.  1 slice of bread.  1 six-inch tortilla.  3 oz (85 g) cooked rice or pasta.  4 oz (113 g) cooked dried beans.  4 oz (113 g) starchy vegetable, such as peas, corn, or potatoes.  4 oz (113 g) hot cereal.  4 oz (113 g) mashed potatoes or  of a large baked potato.  4 oz (113 g) canned or frozen fruit.  4 oz (120 mL) fruit juice.  4-6 crackers.  6 chicken nuggets.  6 oz (170 g) unsweetened dry cereal.  6 oz (170 g) plain fat-free yogurt or yogurt sweetened with artificial sweeteners.  8 oz (240 mL) milk.  8 oz (170 g) fresh fruit or one small piece of fruit.  24 oz (680 g) popped popcorn. Example of carbohydrate counting Sample meal  3 oz (85 g) chicken breast.  6 oz (170 g)   brown rice.  4 oz (113 g) corn.  8 oz (240 mL) milk.  8 oz (170 g) strawberries with sugar-free whipped topping. Carbohydrate calculation 1. Identify the foods that contain carbohydrates: ? Rice. ? Corn. ? Milk. ? Strawberries. 2. Calculate how many servings you have of each food: ? 2 servings rice. ? 1 serving corn. ? 1 serving milk. ? 1 serving strawberries. 3. Multiply each number of servings by 15 g: ? 2 servings rice x 15 g = 30 g. ? 1 serving corn x 15 g = 15 g. ? 1 serving milk x 15 g = 15 g. ? 1 serving  strawberries x 15 g = 15 g. 4. Add together all of the amounts to find the total grams of carbohydrates eaten: ? 30 g + 15 g + 15 g + 15 g = 75 g of carbohydrates total. Summary  Carbohydrate counting is a method of keeping track of how many carbohydrates you eat.  Eating carbohydrates naturally increases the amount of sugar (glucose) in the blood.  Counting how many carbohydrates you eat helps keep your blood glucose within normal limits, which helps you manage your diabetes.  A diet and nutrition specialist (registered dietitian) can help you make a meal plan and calculate how many carbohydrates you should have at each meal and snack. This information is not intended to replace advice given to you by your health care provider. Make sure you discuss any questions you have with your health care provider. Document Released: 12/28/2004 Document Revised: 07/22/2016 Document Reviewed: 06/11/2015 Elsevier Patient Education  2020 [employer]vier Inc.  

## 2018-08-28 NOTE — Assessment & Plan Note (Signed)
Encouraged heart healthy diet, increase exercise, avoid trans fats, consider a krill oil cap daily 

## 2018-08-28 NOTE — Assessment & Plan Note (Signed)
hgba1c acceptable, minimize simple carbs. Increase exercise as tolerated. Continue current meds 

## 2018-08-28 NOTE — Assessment & Plan Note (Signed)
psa checked today

## 2018-08-29 NOTE — Progress Notes (Signed)
Subjective:    Patient ID: Austin Macdonald, male    DOB: 01/23/1955, 63 y.o.   MRN: [id number]  Chief Complaint  Patient presents with  . Hyperlipidemia  . Hypertension    HPI Patient is in today for follow up on chronic medical concerns including hyperglycemia, hyperlipidemia, ADD and more. He is feeling well today. No recent febrile illness or hospitalizations. No polyuria or polydipsia. Denies CP/palp/SOB/HA/congestion/fevers/GI or GU c/o. Taking meds as prescribed  Past Medical History:  Diagnosis Date  . ADD (attention deficit disorder) 12/30/2015  . Benign paroxysmal positional vertigo 12/26/2014  . Chicken pox as a child  . Colon polyps   . Elevated BP 03/03/2015  . Hay fever    seasonal, spring, fall  . Hyperglycemia 03/03/2015  . Measles as a child  . Mumps as a child  . Preventative health care 07/25/2012  . Tinnitus 12/29/2014    Past Surgical History:  Procedure Laterality Date  . knee arthroscopic repair  2004   right    Family History  Problem Relation Age of Onset  . Hypertension Mother   . Heart disease Mother        CHF  . Other Paternal Grandfather        black lung  . Thyroid cancer Daughter   . Dementia Father     Social History   Socioeconomic History  . Marital status: Married    Spouse name: Not on file  . Number of children: Not on file  . Years of education: Not on file  . Highest education level: Not on file  Occupational History  . Not on file  Social Needs  . Financial resource strain: Not on file  . Food insecurity    Worry: Not on file    Inability: Not on file  . Transportation needs    Medical: Not on file    Non-medical: Not on file  Tobacco Use  . Smoking status: Never Smoker  . Smokeless tobacco: Never Used  Substance and Sexual Activity  . Alcohol use: Yes    Alcohol/week: 0.0 standard drinks    Comment: 10-12 drinks weekly  . Drug use: No  . Sexual activity: Yes    Comment: lives with wife, travels for work  [place], no major dietary restrictions  Lifestyle  . Physical activity    Days per week: Not on file    Minutes per session: Not on file  . Stress: Not on file  Relationships  . Social Herbalist on phone: Not on file    Gets together: Not on file    Attends religious service: Not on file    Active member of club or organization: Not on file    Attends meetings of clubs or organizations: Not on file    Relationship status: Not on file  . Intimate partner violence    Fear of current or ex partner: Not on file    Emotionally abused: Not on file    Physically abused: Not on file    Forced sexual activity: Not on file  Other Topics Concern  . Not on file  Social History Narrative  . Not on file    Outpatient Medications Prior to Visit  Medication Sig Dispense Refill  . ibuprofen (ADVIL) 200 MG tablet Take 1 tablet (200 mg total) by mouth every 6 (six) hours as needed for pain. 30 tablet 0  . loratadine (CLARITIN) 10 MG tablet Take 10 mg by mouth daily.    Marland Kitchen  methylphenidate (RITALIN) 5 MG tablet Take 1 tablet (5 mg total) by mouth 2 (two) times daily with breakfast and lunch. 60 tablet 0   No facility-administered medications prior to visit.     No Known Allergies  Review of Systems  Constitutional: Negative for fever and malaise/fatigue.  HENT: Negative for congestion.   Eyes: Negative for blurred vision.  Respiratory: Negative for shortness of breath.   Cardiovascular: Negative for chest pain, palpitations and leg swelling.  Gastrointestinal: Negative for abdominal pain, blood in stool and nausea.  Genitourinary: Negative for dysuria and frequency.  Musculoskeletal: Negative for falls.  Skin: Negative for rash.  Neurological: Negative for dizziness, loss of consciousness and headaches.  Endo/Heme/Allergies: Negative for environmental allergies.  Psychiatric/Behavioral: Negative for depression. The patient is not nervous/anxious.        Objective:     Physical Exam Vitals signs and nursing note reviewed.  Constitutional:      General: He is not in acute distress.    Appearance: Normal appearance. He is well-developed. He is not ill-appearing.  HENT:     Head: Normocephalic and atraumatic.     Nose: Nose normal.  Eyes:     General:        Right eye: No discharge.        Left eye: No discharge.  Neck:     Musculoskeletal: Normal range of motion and neck supple.  Cardiovascular:     Rate and Rhythm: Normal rate and regular rhythm.     Heart sounds: No murmur.  Pulmonary:     Effort: Pulmonary effort is normal.     Breath sounds: Normal breath sounds.  Abdominal:     General: Bowel sounds are normal.     Palpations: Abdomen is soft.     Tenderness: There is no abdominal tenderness.  Skin:    General: Skin is warm and dry.  Neurological:     Mental Status: He is alert and oriented to person, place, and time.  Psychiatric:        Mood and Affect: Mood normal.        Behavior: Behavior normal.     BP (!) 145/77 (BP Location: Left Arm, Cuff Size: Normal)   Pulse 61   Temp (!) 97.3 F (36.3 C) (Temporal)   Resp 12   Ht 6\' 2"  (1.88 m)   Wt 206 lb 6.4 oz (93.6 kg)   SpO2 100%   BMI 26.50 kg/m  Wt Readings from Last 3 Encounters:  08/28/18 206 lb 6.4 oz (93.6 kg)  05/29/18 214 lb (97.1 kg)  11/28/17 213 lb 9.6 oz (96.9 kg)    Diabetic Foot Exam - Simple   No data filed     Lab Results  Component Value Date   WBC 5.7 08/28/2018   HGB 14.6 08/28/2018   HCT 44.1 08/28/2018   PLT 297.0 08/28/2018   GLUCOSE 90 08/28/2018   CHOL 199 08/28/2018   TRIG 244.0 (H) 08/28/2018   HDL 52.20 08/28/2018   LDLDIRECT 112.0 08/28/2018   LDLCALC 98 02/21/2017   ALT 10 08/28/2018   AST 19 08/28/2018   NA 138 08/28/2018   K 4.2 08/28/2018   CL 102 08/28/2018   CREATININE 0.76 08/28/2018   BUN 13 08/28/2018   CO2 28 08/28/2018   TSH 1.23 08/28/2018   PSA 1.01 08/28/2018   HGBA1C 5.8 08/28/2018    Lab Results   Component Value Date   TSH 1.23 08/28/2018   Lab Results  Component  Value Date   WBC 5.7 08/28/2018   HGB 14.6 08/28/2018   HCT 44.1 08/28/2018   MCV 92.4 08/28/2018   PLT 297.0 08/28/2018   Lab Results  Component Value Date   NA 138 08/28/2018   K 4.2 08/28/2018   CO2 28 08/28/2018   GLUCOSE 90 08/28/2018   BUN 13 08/28/2018   CREATININE 0.76 08/28/2018   BILITOT 0.8 08/28/2018   ALKPHOS 54 08/28/2018   AST 19 08/28/2018   ALT 10 08/28/2018   PROT 6.9 08/28/2018   ALBUMIN 4.6 08/28/2018   CALCIUM 9.2 08/28/2018   GFR 102.98 08/28/2018   Lab Results  Component Value Date   CHOL 199 08/28/2018   Lab Results  Component Value Date   HDL 52.20 08/28/2018   Lab Results  Component Value Date   LDLCALC 98 02/21/2017   Lab Results  Component Value Date   TRIG 244.0 (H) 08/28/2018   Lab Results  Component Value Date   CHOLHDL 4 08/28/2018   Lab Results  Component Value Date   HGBA1C 5.8 08/28/2018       Assessment & Plan:   Problem List Items Addressed This Visit    Hyperglycemia    hgba1c acceptable, minimize simple carbs. Increase exercise as tolerated. Continue current meds      Relevant Orders   Hemoglobin A1c (Completed)   Comprehensive metabolic panel (Completed)   TSH (Completed)   ADD (attention deficit disorder) - Primary    Doing well on current meds. No changes      Relevant Orders   Pain Mgmt, Profile 8 w/Conf, U   Hyperlipidemia, mixed    Encouraged heart healthy diet, increase exercise, avoid trans fats, consider a krill oil cap daily      Relevant Orders   Lipid panel (Completed)   TSH (Completed)   Nocturia    psa checked today      Relevant Orders   PSA (Completed)    Other Visit Diagnoses    High risk medication use       Relevant Orders   Pain Mgmt, Profile 8 w/Conf, U   CBC (Completed)      I am having Austin Macdonald "Walt" maintain his loratadine, ibuprofen, and methylphenidate.  No orders of the defined  types were placed in this encounter.    Penni Homans, MD

## 2018-08-30 LAB — PAIN MGMT, PROFILE 8 W/CONF, U
6 Acetylmorphine: NEGATIVE ng/mL
Alcohol Metabolites: NEGATIVE ng/mL (ref <500)
Amphetamines: NEGATIVE ng/mL
Benzodiazepines: NEGATIVE ng/mL
Buprenorphine, Urine: NEGATIVE ng/mL
Cocaine Metabolite: NEGATIVE ng/mL
Creatinine: 27.1 mg/dL
Ethyl Glucuronide (ETG): NEGATIVE ng/mL
Ethyl Sulfate (ETS): NEGATIVE ng/mL
MDMA: NEGATIVE ng/mL
Marijuana Metabolite: NEGATIVE ng/mL
Opiates: NEGATIVE ng/mL
Oxidant: NEGATIVE ug/mL
Oxycodone: NEGATIVE ng/mL
pH: 7.4 (ref 4.5–9.0)

## 2018-09-20 ENCOUNTER — Other Ambulatory Visit: Payer: Self-pay | Admitting: Family Medicine

## 2018-09-20 DIAGNOSIS — F988 Other specified behavioral and emotional disorders with onset usually occurring in childhood and adolescence: Secondary | ICD-10-CM

## 2018-09-20 MED ORDER — METHYLPHENIDATE HCL 5 MG PO TABS: 5.0000 mg | ORAL_TABLET | Freq: Two times a day (BID) | ORAL | 0 refills | Status: DC

## 2018-09-20 NOTE — Telephone Encounter (Signed)
Medication: methylphenidate (RITALIN) 5 MG tablet [id number]   Pharmacy:  [id number] - [place], [place] [phone] (Phone) [phone] (Fax)

## 2018-09-20 NOTE — Telephone Encounter (Signed)
Requested medication (s) are due for refill today: yes  Requested medication (s) are on the active medication list: yes  Last refill:  08/21/2018  Future visit scheduled: yes  Notes to clinic:  This refill cannot be delegated   Requested Prescriptions  Pending Prescriptions Disp Refills   methylphenidate (RITALIN) 5 MG tablet 60 tablet 0    Sig: Take 1 tablet (5 mg total) by mouth 2 (two) times daily with breakfast and lunch.     Not Delegated - Psychiatry:  Stimulants/ADHD Failed - 09/20/2018  9:20 AM      Failed - This refill cannot be delegated      Passed - Urine Drug Screen completed in last 360 days.      Passed - Valid encounter within last 3 months    Recent Outpatient Visits          3 weeks ago Attention deficit disorder (ADD) without hyperactivity   [facility] [place], [facility]   3 months ago Hyperglycemia   [facility] [place], MD   8 months ago High risk medication use   [facility] [place], MD   9 months ago Attention deficit disorder (ADD) without hyperactivity   [facility] [place], MD   1 year ago High risk medication use   [facility], MD      Future Appointments            In 5 months Mosie Lukes, MD [place] [facility] [place]

## 2018-10-10 ENCOUNTER — Other Ambulatory Visit: Payer: Self-pay

## 2018-10-10 ENCOUNTER — Ambulatory Visit (INDEPENDENT_AMBULATORY_CARE_PROVIDER_SITE_OTHER): Payer: BC Managed Care – PPO

## 2018-10-10 DIAGNOSIS — Z23 Encounter for immunization: Secondary | ICD-10-CM | POA: Diagnosis not present

## 2018-10-20 ENCOUNTER — Other Ambulatory Visit: Payer: Self-pay | Admitting: Family Medicine

## 2018-10-20 DIAGNOSIS — F988 Other specified behavioral and emotional disorders with onset usually occurring in childhood and adolescence: Secondary | ICD-10-CM

## 2018-10-20 NOTE — Telephone Encounter (Signed)
Requested medication (s) are due for refill today: yes  Requested medication (s) are on the active medication list: yes  Last refill:  09/20/2018  Future visit scheduled: yes  Notes to clinic:  Refill cannot be delegated   Requested Prescriptions  Pending Prescriptions Disp Refills   methylphenidate (RITALIN) 5 MG tablet 60 tablet 0    Sig: Take 1 tablet (5 mg total) by mouth 2 (two) times daily with breakfast and lunch.     Not Delegated - Psychiatry:  Stimulants/ADHD Failed - 10/20/2018 11:11 AM      Failed - This refill cannot be delegated      Passed - Urine Drug Screen completed in last 360 days.      Passed - Valid encounter within last 3 months    Recent Outpatient Visits          1 month ago Attention deficit disorder (ADD) without hyperactivity   [facility] [place], MD   4 months ago Hyperglycemia   [facility], MD   9 months ago High risk medication use   [facility], MD   10 months ago Attention deficit disorder (ADD) without hyperactivity   [facility] [place], MD   1 year ago High risk medication use   [facility] [place], MD      Future Appointments            In 4 months Mosie Lukes, MD [place] [place]

## 2018-10-20 NOTE — Telephone Encounter (Addendum)
Last RX: Methlyphenidate 5mg  twice a day #60. 09/20/18 Last OV: 08/28/18 Next OV: 03/05/19 UDS: 08/25/18,low risk CSC: 08/25/18

## 2018-10-20 NOTE — Telephone Encounter (Signed)
Medication Refill - Medication: methylphenidate (RITALIN) 5 MG tablet   Has the patient contacted their pharmacy? Yes.   (Agent: If no, request that the patient contact the pharmacy for the refill.) (Agent: If yes, when and what did the pharmacy advise?)  Preferred Pharmacy (with phone number or street name): [id number] [place], [address]  Agent: Please be advised that RX refills may take up to 3 business days. We ask that you follow-up with your pharmacy.

## 2018-10-22 MED ORDER — METHYLPHENIDATE HCL 5 MG PO TABS: 5.0000 mg | ORAL_TABLET | Freq: Two times a day (BID) | ORAL | 0 refills | Status: DC

## 2018-11-22 ENCOUNTER — Other Ambulatory Visit: Payer: Self-pay | Admitting: Family Medicine

## 2018-11-22 DIAGNOSIS — F988 Other specified behavioral and emotional disorders with onset usually occurring in childhood and adolescence: Secondary | ICD-10-CM

## 2018-11-22 MED ORDER — METHYLPHENIDATE HCL 5 MG PO TABS: 5.0000 mg | ORAL_TABLET | Freq: Two times a day (BID) | ORAL | 0 refills | Status: DC

## 2018-11-22 NOTE — Telephone Encounter (Signed)
Requested medication (s) are due for refill today: yes  Requested medication (s) are on the active medication list: yes  Last refill:  10/22/2018  Future visit scheduled: yes  Notes to clinic:  Refill cannot be delegated    Requested Prescriptions  Pending Prescriptions Disp Refills   methylphenidate (RITALIN) 5 MG tablet 60 tablet 0    Sig: Take 1 tablet (5 mg total) by mouth 2 (two) times daily with breakfast and lunch.     Not Delegated - Psychiatry:  Stimulants/ADHD Failed - 11/22/2018  8:55 AM      Failed - This refill cannot be delegated      Passed - Urine Drug Screen completed in last 360 days.      Passed - Valid encounter within last 3 months    Recent Outpatient Visits          2 months ago Attention deficit disorder (ADD) without hyperactivity   [facility] [place], MD   5 months ago Hyperglycemia   [facility] [place], MD   11 months ago High risk medication use   [facility] [place], MD   11 months ago Attention deficit disorder (ADD) without hyperactivity   [facility] [place], MD   1 year ago High risk medication use   [facility] [place], MD      Future Appointments            In 3 months Mosie Lukes, MD [place] [facility] [place]

## 2018-11-22 NOTE — Telephone Encounter (Signed)
Copied from [place] [id number]. Topic: Quick Communication - Rx Refill/Question >> Nov 22, 2018  8:51 AM Rainey Pines A wrote: Medication: methylphenidate (RITALIN) 5 MG tablet   Has the patient contacted their pharmacy? Yes (Agent: If no, request that the patient contact the pharmacy for the refill.) (Agent: If yes, when and what did the pharmacy advise?)Contact PCP  Preferred Pharmacy (with phone number or street name): [id number] [place], [place] [phone] (Phone) [phone] (Fax)    Agent: Please be advised that RX refills may take up to 3 business days. We ask that you follow-up with your pharmacy.

## 2018-11-22 NOTE — Telephone Encounter (Signed)
Requesting: Ritalin Contract: 08/25/2018 UDS: 08/31/2018, low risk, 02/2019 Last OV: 08/28/2018 Next OV: 03/05/19 Last Refill: 10/22/2018, #60--0 RF Database:   Please advise

## 2018-12-06 DIAGNOSIS — L814 Other melanin hyperpigmentation: Secondary | ICD-10-CM | POA: Diagnosis not present

## 2018-12-06 DIAGNOSIS — L821 Other seborrheic keratosis: Secondary | ICD-10-CM | POA: Diagnosis not present

## 2018-12-06 DIAGNOSIS — L570 Actinic keratosis: Secondary | ICD-10-CM | POA: Diagnosis not present

## 2018-12-06 DIAGNOSIS — D2230 Melanocytic nevi of unspecified part of face: Secondary | ICD-10-CM | POA: Diagnosis not present

## 2018-12-06 DIAGNOSIS — Z808 Family history of malignant neoplasm of other organs or systems: Secondary | ICD-10-CM | POA: Diagnosis not present

## 2018-12-26 ENCOUNTER — Telehealth: Payer: Self-pay | Admitting: Family Medicine

## 2018-12-26 NOTE — Telephone Encounter (Signed)
Medication Refill - Medication:  methylphenidate (RITALIN) 5 MG tablet   Has the patient contacted their pharmacy? Yes.   (Agent: If no, request that the patient contact the pharmacy for the refill.) (Agent: If yes, when and what did the pharmacy advise?)  Preferred Pharmacy (with phone number or street name):  [id number] [place], [address]  [place] [address]  Phone: [phone] Fax: [phone]    Agent: Please be advised that RX refills may take up to 3 business days. We ask that you follow-up with your pharmacy.

## 2018-12-26 NOTE — Telephone Encounter (Signed)
Signed accidentally, pt is completely out and needs refill soon. Please advise

## 2018-12-28 ENCOUNTER — Other Ambulatory Visit: Payer: Self-pay | Admitting: Family Medicine

## 2018-12-28 DIAGNOSIS — F988 Other specified behavioral and emotional disorders with onset usually occurring in childhood and adolescence: Secondary | ICD-10-CM

## 2018-12-28 MED ORDER — METHYLPHENIDATE HCL 5 MG PO TABS: 5.0000 mg | ORAL_TABLET | Freq: Two times a day (BID) | ORAL | 0 refills | Status: DC

## 2019-01-26 ENCOUNTER — Other Ambulatory Visit: Payer: Self-pay | Admitting: Family Medicine

## 2019-01-26 DIAGNOSIS — F988 Other specified behavioral and emotional disorders with onset usually occurring in childhood and adolescence: Secondary | ICD-10-CM

## 2019-01-26 MED ORDER — METHYLPHENIDATE HCL 5 MG PO TABS: 5.0000 mg | ORAL_TABLET | Freq: Two times a day (BID) | ORAL | 0 refills | Status: DC

## 2019-01-26 NOTE — Telephone Encounter (Signed)
Requesting:Ritalin Contract:09/15/2018 UDS:08/28/2018 Last Visit:08/28/2018 Next Visit:03/05/2019 Last Refill:12/28/2018  Please Advise

## 2019-01-29 ENCOUNTER — Telehealth: Payer: Self-pay

## 2019-01-29 NOTE — Telephone Encounter (Signed)
PA initiated via Covermymeds; KEY: [id number]. PA approved. Effective 01/29/2019 to 01/28/2022.

## 2019-02-26 ENCOUNTER — Telehealth: Payer: Self-pay | Admitting: Family Medicine

## 2019-02-26 NOTE — Telephone Encounter (Signed)
Medication: methylphenidate (RITALIN) 5 MG tablet  Has the patient contacted their pharmacy? No. (If no, request that the patient contact the pharmacy for the refill.) (If yes, when and what did the pharmacy advise?)  Preferred Pharmacy (with phone number or street name):[id number] [place], [address]  [address], [place] [address]  Phone:  [phone] Fax:  [phone]   Agent: Please be advised that RX refills may take up to 3 business days. We ask that you follow-up with your pharmacy.

## 2019-02-27 ENCOUNTER — Other Ambulatory Visit: Payer: Self-pay | Admitting: Family Medicine

## 2019-02-27 DIAGNOSIS — F988 Other specified behavioral and emotional disorders with onset usually occurring in childhood and adolescence: Secondary | ICD-10-CM

## 2019-02-27 MED ORDER — METHYLPHENIDATE HCL 5 MG PO TABS: 5.0000 mg | ORAL_TABLET | Freq: Two times a day (BID) | ORAL | 0 refills | Status: DC

## 2019-02-27 NOTE — Telephone Encounter (Signed)
Requesting: Methylphenidate Contract: Yes UDS: UTD Last OV: 8.17.2020 Next OV: 2.22.2021 Last Refill: 1.15.2021   Please advise/thx dmf

## 2019-03-05 ENCOUNTER — Encounter: Payer: Self-pay | Admitting: Family Medicine

## 2019-03-05 ENCOUNTER — Ambulatory Visit (INDEPENDENT_AMBULATORY_CARE_PROVIDER_SITE_OTHER): Payer: BC Managed Care – PPO | Admitting: Family Medicine

## 2019-03-05 ENCOUNTER — Other Ambulatory Visit: Payer: Self-pay

## 2019-03-05 VITALS — BP 130/82 | HR 54 | Temp 96.3°F | Ht 75.0 in | Wt 216.4 lb

## 2019-03-05 DIAGNOSIS — M79644 Pain in right finger(s): Secondary | ICD-10-CM | POA: Diagnosis not present

## 2019-03-05 DIAGNOSIS — R739 Hyperglycemia, unspecified: Secondary | ICD-10-CM | POA: Diagnosis not present

## 2019-03-05 DIAGNOSIS — M7732 Calcaneal spur, left foot: Secondary | ICD-10-CM | POA: Diagnosis not present

## 2019-03-05 DIAGNOSIS — F988 Other specified behavioral and emotional disorders with onset usually occurring in childhood and adolescence: Secondary | ICD-10-CM

## 2019-03-05 DIAGNOSIS — E785 Hyperlipidemia, unspecified: Secondary | ICD-10-CM

## 2019-03-05 DIAGNOSIS — E782 Mixed hyperlipidemia: Secondary | ICD-10-CM

## 2019-03-05 DIAGNOSIS — H6590 Unspecified nonsuppurative otitis media, unspecified ear: Secondary | ICD-10-CM | POA: Insufficient documentation

## 2019-03-05 DIAGNOSIS — H6593 Unspecified nonsuppurative otitis media, bilateral: Secondary | ICD-10-CM

## 2019-03-05 LAB — LIPID PANEL
Cholesterol: 211 mg/dL — ABNORMAL HIGH (ref 0–200)
HDL: 59.4 mg/dL (ref >39.00)
LDL Cholesterol: 120 mg/dL — ABNORMAL HIGH (ref 0–99)
NonHDL: 151.78
Total CHOL/HDL Ratio: 4
Triglycerides: 157 mg/dL — ABNORMAL HIGH (ref 0.0–149.0)
VLDL: 31.4 mg/dL (ref 0.0–40.0)

## 2019-03-05 LAB — COMPREHENSIVE METABOLIC PANEL
ALT: 9 U/L (ref 0–53)
AST: 17 U/L (ref 0–37)
Albumin: 4.4 g/dL (ref 3.5–5.2)
Alkaline Phosphatase: 55 U/L (ref 39–117)
BUN: 14 mg/dL (ref 6–23)
CO2: 29 mEq/L (ref 19–32)
Calcium: 9.4 mg/dL (ref 8.4–10.5)
Chloride: 104 mEq/L (ref 96–112)
Creatinine, Ser: 0.78 mg/dL (ref 0.40–1.50)
GFR: 99.78 mL/min (ref >60.00)
Glucose, Bld: 108 mg/dL — ABNORMAL HIGH (ref 70–99)
Potassium: 4.7 mEq/L (ref 3.5–5.1)
Sodium: 140 mEq/L (ref 135–145)
Total Bilirubin: 0.7 mg/dL (ref 0.2–1.2)
Total Protein: 6.6 g/dL (ref 6.0–8.3)

## 2019-03-05 LAB — CBC
HCT: 42.9 % (ref 39.0–52.0)
Hemoglobin: 14.3 g/dL (ref 13.0–17.0)
MCHC: 33.3 g/dL (ref 30.0–36.0)
MCV: 92.8 fl (ref 78.0–100.0)
Platelets: 264 10*3/uL (ref 150.0–400.0)
RBC: 4.63 Mil/uL (ref 4.22–5.81)
RDW: 12.7 % (ref 11.5–15.5)
WBC: 4.7 10*3/uL (ref 4.0–10.5)

## 2019-03-05 LAB — HEMOGLOBIN A1C: Hgb A1c MFr Bld: 5.8 % (ref 4.6–6.5)

## 2019-03-05 LAB — TSH: TSH: 1.66 u[IU]/mL (ref 0.35–4.50)

## 2019-03-05 NOTE — Assessment & Plan Note (Signed)
Encouraged stretching, ice topical treatments better shoes. He has seen podiatry in past is going to try better inserts in his shoes.

## 2019-03-05 NOTE — Assessment & Plan Note (Signed)
Stable on current meds 

## 2019-03-05 NOTE — Assessment & Plan Note (Signed)
hgba1c acceptable, minimize simple carbs. Increase exercise as tolerated.  

## 2019-03-05 NOTE — Assessment & Plan Note (Signed)
Loratadine bid, Flonase and nasal saline

## 2019-03-05 NOTE — Assessment & Plan Note (Addendum)
Pain from thenar prominence into lower arm worse with movement. Encouraged icing, topical creams and splinting. If continues will need referral. Due to wrist pain and worsening with movement he is fitted and supplied  with a wrist splint by our office to promote immobility and speed healing from likely over use injury

## 2019-03-05 NOTE — Patient Instructions (Signed)
[employer] Blood Pressure cuff, upper arm, want BP 100-140/60-90 Pulse oximeter, want oxygen in 90s  Weekly vitals  Take Multivitamin with minerals, selenium Vitamin D 1000-2000 IU daily Probiotic with lactobacillus and bifidophilus Asprin EC 81 mg daily  Melatonin 2-5 mg at bedtime  [email or web address].[email or web address] ToxicBlast.[email or web address]  Loratadine once to twice a day, flonase and nasal saline Heel Spur  A heel spur is a bony growth that forms on the bottom of the heel bone (calcaneus). Heel spurs are common. They often cause inflammation in the band of tissue that connects the toes to the heel bone (plantar fascia). This may cause pain on the bottom of the foot, near the heel. Many people with plantar fasciitis also have heel spurs. However, spurs are not the cause of plantar fasciitis pain. What are the causes? The exact cause of heel spurs is not known. They may be caused by:  Pressure on the heel bone.  Bands of tissue (tendons) pulling on the heel bone. What increases the risk? You are more likely to develop this condition if you:  Are older than 40.  Are overweight.  Have wear-and-tear arthritis (osteoarthritis).  Have plantar fascia inflammation.  Participate in sports or activities that include a lot of running or jumping.  Wear poorly fitted shoes. What are the signs or symptoms? Some people have no symptoms. If you do have symptoms, they may include:  Pain in the bottom of your heel.  Pain that is worse when you first get out of bed.  Pain that gets worse after walking or standing. How is this diagnosed? This condition may be diagnosed based on:  Your symptoms and medical history.  A physical exam.  A foot X-ray. How is this treated? Treatment for this condition depends on how much pain you have. Treatment options may include:  Doing stretching exercises.  Losing weight, if necessary.  Wearing specific shoes or inserts inside of shoes (orthotics) for  comfort and support.  Wearing splints on your feet while you sleep. Splints keep your feet in a position (usually 90 degrees) that should prevent and relieve the pain you feel when you first get out of bed. They also make stretching easier in the morning.  Taking over-the-counter medicine to relieve pain, such as NSAIDs.  Using high-intensity sound waves to break up the heel spur (extracorporeal shock wave therapy).  Getting steroid injections in your heel to reduce inflammation.  Having surgery, if your heel spur causes long-term (chronic) pain. Follow these instructions at home:  Activity  Avoid activities that cause pain until you recover, or for as long as directed by your health care provider.  Do stretching exercises as directed. Stretch before exercising or being physically active. Managing pain, stiffness, and swelling  If directed, put ice on your foot: ? Put ice in a plastic bag. ? Place a towel between your skin and the bag. ? Leave the ice on for 20 minutes, 2-3 times a day.  Move your toes often to avoid stiffness and to lessen swelling.  When possible, raise (elevate) your foot above the level of your heart while you are sitting or lying down. General instructions  Take over-the-counter and prescription medicines only as told by your health care provider.  Wear supportive shoes that fit well. Wear splints, inserts, or orthotics as told by your health care provider.  If recommended, work with your health care provider to lose weight. This can relieve pressure on your foot.  Do not use any products that contain  nicotine or tobacco, such as cigarettes and e-cigarettes. These can affect bone growth and healing. If you need help quitting, ask your health care provider.  Keep all follow-up visits as told by your health care provider. This is important. Contact a health care provider if:  Your pain does not go away with treatment.  Your pain gets worse. Summary  A  heel spur is a bony growth that forms on the bottom of the heel bone (calcaneus).  Heel spurs often cause inflammation in the band of tissue that connects the toes to the heel bone (plantar fascia). This may cause pain on the bottom of the foot, near the heel.  Doing stretching exercises, losing weight, wearing specific shoes or shoe inserts, wearing splints while you sleep, and taking pain medicine may ease the pain and stiffness.  Other treatment options may include high-intensity sound waves to break up the heel spur, steroid injections, or surgery. This information is not intended to replace advice given to you by your health care provider. Make sure you discuss any questions you have with your health care provider. Document Revised: 12/15/2016 Document Reviewed: 12/15/2016 Elsevier Patient Education  2020 [employer].

## 2019-03-07 NOTE — Progress Notes (Addendum)
Subjective:    Patient ID: Austin Macdonald, male    DOB: [birth year], 64 y.o.   MRN: [id number]  Chief Complaint  Patient presents with  . Follow-up    6 month  . Ear Fullness    both  . Plantar Fasciitis    left heel    HPI Patient is in today for follow up on chronic medical concerns. He feels well today. No recent febrile illness or hospitalizations. He has been maintaining quarantine. He has been exercising nearly daily and maintaining a heart healthy daily. He is noting some pressure in his ears and long standing tinnitus which is still present. He is also noting right wrist/thumb pain for past 6 months. No fall or trauma. Denies CP/palp/SOB/HA/congestion/fevers/GI or GU c/o. Taking meds as prescribed  Past Medical History:  Diagnosis Date  . ADD (attention deficit disorder) 12/30/2015  . Benign paroxysmal positional vertigo 12/26/2014  . Chicken pox as a child  . Colon polyps   . Elevated BP 03/03/2015  . Hay fever    seasonal, spring, fall  . Hyperglycemia 03/03/2015  . Measles as a child  . Mumps as a child  . Preventative health care 07/25/2012  . Tinnitus 12/29/2014    Past Surgical History:  Procedure Laterality Date  . knee arthroscopic repair  2004   right    Family History  Problem Relation Age of Onset  . Hypertension Mother   . Heart disease Mother        CHF  . Other Paternal Grandfather        black lung  . Thyroid cancer Daughter   . Dementia Father     Social History   Socioeconomic History  . Marital status: Married    Spouse name: Not on file  . Number of children: Not on file  . Years of education: Not on file  . Highest education level: Not on file  Occupational History  . Not on file  Tobacco Use  . Smoking status: Never Smoker  . Smokeless tobacco: Never Used  Substance and Sexual Activity  . Alcohol use: Yes    Alcohol/week: 0.0 standard drinks    Comment: 10-12 drinks weekly  . Drug use: No  . Sexual activity: Yes   Comment: lives with wife, travels for work [place], no major dietary restrictions  Other Topics Concern  . Not on file  Social History Narrative  . Not on file   Social Determinants of Health   Financial Resource Strain:   . Difficulty of Paying Living Expenses: Not on file  Food Insecurity:   . Worried About Charity fundraiser in the Last Year: Not on file  . Ran Out of Food in the Last Year: Not on file  Transportation Needs:   . Lack of Transportation (Medical): Not on file  . Lack of Transportation (Non-Medical): Not on file  Physical Activity:   . Days of Exercise per Week: Not on file  . Minutes of Exercise per Session: Not on file  Stress:   . Feeling of Stress : Not on file  Social Connections:   . Frequency of Communication with Friends and Family: Not on file  . Frequency of Social Gatherings with Friends and Family: Not on file  . Attends Religious Services: Not on file  . Active Member of Clubs or Organizations: Not on file  . Attends Archivist Meetings: Not on file  . Marital Status: Not on file  Intimate Partner Violence:   .  Fear of Current or Ex-Partner: Not on file  . Emotionally Abused: Not on file  . Physically Abused: Not on file  . Sexually Abused: Not on file    Outpatient Medications Prior to Visit  Medication Sig Dispense Refill  . ibuprofen (ADVIL) 200 MG tablet Take 1 tablet (200 mg total) by mouth every 6 (six) hours as needed for pain. 30 tablet 0  . loratadine (CLARITIN) 10 MG tablet Take 10 mg by mouth daily.    . methylphenidate (RITALIN) 5 MG tablet Take 1 tablet (5 mg total) by mouth 2 (two) times daily with breakfast and lunch. February 2021 60 tablet 0   No facility-administered medications prior to visit.    No Known Allergies  Review of Systems  Constitutional: Negative for fever and malaise/fatigue.  HENT: Positive for ear pain and tinnitus. Negative for congestion, ear discharge and hearing loss.   Eyes: Negative for  blurred vision.  Respiratory: Negative for shortness of breath.   Cardiovascular: Negative for chest pain, palpitations and leg swelling.  Gastrointestinal: Negative for abdominal pain, blood in stool and nausea.  Genitourinary: Negative for dysuria and frequency.  Musculoskeletal: Positive for joint pain. Negative for falls.  Skin: Negative for rash.  Neurological: Negative for dizziness, loss of consciousness and headaches.  Endo/Heme/Allergies: Negative for environmental allergies.  Psychiatric/Behavioral: Negative for depression. The patient is not nervous/anxious.        Objective:    Physical Exam Vitals and nursing note reviewed.  Constitutional:      General: He is not in acute distress.    Appearance: He is well-developed.  HENT:     Head: Normocephalic and atraumatic.     Nose: Nose normal.  Eyes:     General:        Right eye: No discharge.        Left eye: No discharge.  Cardiovascular:     Rate and Rhythm: Normal rate and regular rhythm.     Heart sounds: No murmur.  Pulmonary:     Effort: Pulmonary effort is normal.     Breath sounds: Normal breath sounds.  Abdominal:     General: Bowel sounds are normal.     Palpations: Abdomen is soft.     Tenderness: There is no abdominal tenderness.  Musculoskeletal:     Cervical back: Normal range of motion and neck supple.  Skin:    General: Skin is warm and dry.  Neurological:     Mental Status: He is alert and oriented to person, place, and time.     BP 130/82 (BP Location: Left Arm, Patient Position: Sitting, Cuff Size: Normal)   Pulse (!) 54   Temp (!) 96.3 F (35.7 C) (Temporal)   Ht 6\' 3"  (1.905 m)   Wt 216 lb 6 oz (98.1 kg)   SpO2 98%   BMI 27.05 kg/m  Wt Readings from Last 3 Encounters:  03/05/19 216 lb 6 oz (98.1 kg)  08/28/18 206 lb 6.4 oz (93.6 kg)  05/29/18 214 lb (97.1 kg)    Diabetic Foot Exam - Simple   No data filed     Lab Results  Component Value Date   WBC 4.7 03/05/2019   HGB  14.3 03/05/2019   HCT 42.9 03/05/2019   PLT 264.0 03/05/2019   GLUCOSE 108 (H) 03/05/2019   CHOL 211 (H) 03/05/2019   TRIG 157.0 (H) 03/05/2019   HDL 59.40 03/05/2019   LDLDIRECT 112.0 08/28/2018   LDLCALC 120 (H) 03/05/2019  ALT 9 03/05/2019   AST 17 03/05/2019   NA 140 03/05/2019   K 4.7 03/05/2019   CL 104 03/05/2019   CREATININE 0.78 03/05/2019   BUN 14 03/05/2019   CO2 29 03/05/2019   TSH 1.66 03/05/2019   PSA 1.01 08/28/2018   HGBA1C 5.8 03/05/2019    Lab Results  Component Value Date   TSH 1.66 03/05/2019   Lab Results  Component Value Date   WBC 4.7 03/05/2019   HGB 14.3 03/05/2019   HCT 42.9 03/05/2019   MCV 92.8 03/05/2019   PLT 264.0 03/05/2019   Lab Results  Component Value Date   NA 140 03/05/2019   K 4.7 03/05/2019   CO2 29 03/05/2019   GLUCOSE 108 (H) 03/05/2019   BUN 14 03/05/2019   CREATININE 0.78 03/05/2019   BILITOT 0.7 03/05/2019   ALKPHOS 55 03/05/2019   AST 17 03/05/2019   ALT 9 03/05/2019   PROT 6.6 03/05/2019   ALBUMIN 4.4 03/05/2019   CALCIUM 9.4 03/05/2019   GFR 99.78 03/05/2019   Lab Results  Component Value Date   CHOL 211 (H) 03/05/2019   Lab Results  Component Value Date   HDL 59.40 03/05/2019   Lab Results  Component Value Date   LDLCALC 120 (H) 03/05/2019   Lab Results  Component Value Date   TRIG 157.0 (H) 03/05/2019   Lab Results  Component Value Date   CHOLHDL 4 03/05/2019   Lab Results  Component Value Date   HGBA1C 5.8 03/05/2019       Assessment & Plan:   Problem List Items Addressed This Visit    Hyperglycemia    hgba1c acceptable, minimize simple carbs. Increase exercise as tolerated.       Relevant Orders   Hemoglobin A1c (Completed)   Comprehensive metabolic panel (Completed)   TSH (Completed)   ADD (attention deficit disorder)    Stable on current meds      Hyperlipidemia, mixed    Encouraged heart healthy diet, increase exercise, avoid trans fats and simple carbs      Heel  spur, left    Encouraged stretching, ice topical treatments better shoes. He has seen podiatry in past is going to try better inserts in his shoes.       Relevant Orders   CBC (Completed)   Pain of right thumb    Pain from thenar prominence into lower arm worse with movement. Encouraged icing, topical creams and splinting. If continues will need referral. Due to wrist pain and worsening with movement he is fitted and supplied  with a wrist splint by our office to promote immobility and speed healing from likely over use injury      SOM (secretory otitis media)    Loratadine bid, Flonase and nasal saline       Other Visit Diagnoses    Hyperlipidemia, unspecified hyperlipidemia type    -  Primary   Relevant Orders   Comprehensive metabolic panel (Completed)   Lipid panel (Completed)   TSH (Completed)      I am having Nehemiah Massed "Walt" maintain his loratadine, ibuprofen, and methylphenidate.  No orders of the defined types were placed in this encounter.    Penni Homans, MD

## 2019-03-07 NOTE — Assessment & Plan Note (Signed)
Encouraged heart healthy diet, increase exercise, avoid trans fats and simple carbs.  

## 2019-03-27 ENCOUNTER — Other Ambulatory Visit: Payer: Self-pay | Admitting: Family Medicine

## 2019-03-27 DIAGNOSIS — F988 Other specified behavioral and emotional disorders with onset usually occurring in childhood and adolescence: Secondary | ICD-10-CM

## 2019-03-27 MED ORDER — METHYLPHENIDATE HCL 5 MG PO TABS: 5.0000 mg | ORAL_TABLET | Freq: Two times a day (BID) | ORAL | 0 refills | Status: DC

## 2019-03-27 NOTE — Telephone Encounter (Signed)
Medication:methylphenidate (RITALIN) 5 MG tablet [id number]   Has the patient contacted their pharmacy? No. (If no, request that the patient contact the pharmacy for the refill.) (If yes, when and what did the pharmacy advise?)  Preferred Pharmacy (with phone number or street name):   [id number] - [place], [address], [place] [address]  Phone:  [phone] Fax:  [phone]    Agent: Please be advised that RX refills may take up to 3 business days. We ask that you follow-up with your pharmacy.

## 2019-03-27 NOTE — Telephone Encounter (Signed)
Sent in rf thanks

## 2019-03-27 NOTE — Telephone Encounter (Signed)
Requesting:ritalin  Contract:yes UDS:low risk next screen 02/28/19 Last OV:03/05/19 Next OV:09/03/19 Last Refill: 02/27/19 #[id number] Database:   Please advise

## 2019-04-26 ENCOUNTER — Other Ambulatory Visit: Payer: Self-pay | Admitting: Family Medicine

## 2019-04-26 ENCOUNTER — Telehealth: Payer: Self-pay

## 2019-04-26 DIAGNOSIS — F988 Other specified behavioral and emotional disorders with onset usually occurring in childhood and adolescence: Secondary | ICD-10-CM

## 2019-04-26 MED ORDER — METHYLPHENIDATE HCL 5 MG PO TABS: 5.0000 mg | ORAL_TABLET | Freq: Two times a day (BID) | ORAL | 0 refills | Status: DC

## 2019-04-26 NOTE — Telephone Encounter (Signed)
Requesting: ritalin Contract:9.4.20 UDS:8.17.20 Last Visit:2.22.21 Next Visit:8.23.21 Last Refill:3.16.21  Please Advise

## 2019-04-26 NOTE — Telephone Encounter (Signed)
Sent in

## 2019-04-26 NOTE — Telephone Encounter (Signed)
Patient called in to see if Dr. Charlett Blake could send in a prescription for methylphenidate (RITALIN) 5 MG tablet [id number]    [facility] to [id number] - [place], [address], [place] [address]  Phone:  [phone] Fax:  [phone]  DEA #:  [id number]

## 2019-05-29 ENCOUNTER — Telehealth: Payer: Self-pay

## 2019-05-29 ENCOUNTER — Other Ambulatory Visit: Payer: Self-pay | Admitting: Family Medicine

## 2019-05-29 DIAGNOSIS — F988 Other specified behavioral and emotional disorders with onset usually occurring in childhood and adolescence: Secondary | ICD-10-CM

## 2019-05-29 MED ORDER — METHYLPHENIDATE HCL 5 MG PO TABS: 5.0000 mg | ORAL_TABLET | Freq: Two times a day (BID) | ORAL | 0 refills | Status: DC

## 2019-05-29 NOTE — Telephone Encounter (Signed)
Patient called in to get a prescription refill for methylphenidate (RITALIN) 5 MG tablet [id number]    [facility] to [id number] - [place], [address], [place] [address]  Phone:  [phone] Fax:  [phone]  DEA #:  [id number]

## 2019-05-29 NOTE — Telephone Encounter (Signed)
Last written: 04/26/19 Last ov: 03/05/19 Next ov: 09/03/19 Contract: will get at next visit [id number] get next visit.

## 2019-06-28 ENCOUNTER — Telehealth: Payer: Self-pay | Admitting: Family Medicine

## 2019-06-28 NOTE — Telephone Encounter (Signed)
Medication: methylphenidate (RITALIN) 5 MG tablet      Has the patient contacted their pharmacy?  (If no, request that the patient contact the pharmacy for the refill.) (If yes, when and what did the pharmacy advise?)     Preferred Pharmacy (with phone number or street name): [id number] - [place], [address]  [address], [place] [address]  Phone:  [phone] Fax:  [phone]      Agent: Please be advised that RX refills may take up to 3 business days. We ask that you follow-up with your pharmacy.

## 2019-06-28 NOTE — Telephone Encounter (Signed)
Requesting: ritalin Contract:09/15/2018 UDS:08/28/18 Last Visit:03/05/19 Next Visit:09/03/19 Last Refill:05/29/19  Please Advise

## 2019-06-29 ENCOUNTER — Other Ambulatory Visit: Payer: Self-pay | Admitting: Family Medicine

## 2019-06-29 DIAGNOSIS — F988 Other specified behavioral and emotional disorders with onset usually occurring in childhood and adolescence: Secondary | ICD-10-CM

## 2019-06-29 MED ORDER — METHYLPHENIDATE HCL 5 MG PO TABS: 5.0000 mg | ORAL_TABLET | Freq: Two times a day (BID) | ORAL | 0 refills | Status: DC

## 2019-06-29 NOTE — Telephone Encounter (Signed)
Patient notified that rx has been sent in. 

## 2019-06-29 NOTE — Telephone Encounter (Signed)
Sent in refill

## 2019-07-11 ENCOUNTER — Encounter: Payer: Self-pay | Admitting: Internal Medicine

## 2019-07-11 ENCOUNTER — Other Ambulatory Visit: Payer: Self-pay

## 2019-07-11 ENCOUNTER — Ambulatory Visit (INDEPENDENT_AMBULATORY_CARE_PROVIDER_SITE_OTHER): Payer: BC Managed Care – PPO | Admitting: Internal Medicine

## 2019-07-11 ENCOUNTER — Ambulatory Visit (HOSPITAL_BASED_OUTPATIENT_CLINIC_OR_DEPARTMENT_OTHER)
Admission: RE | Admit: 2019-07-11 | Discharge: 2019-07-11 | Disposition: A | Discharge status: Home / Self Care | Payer: BC Managed Care – PPO | Source: Ambulatory Visit | Attending: Internal Medicine | Admitting: Internal Medicine

## 2019-07-11 VITALS — BP 149/77 | HR 61 | Temp 97.3°F | Resp 18 | Ht 75.0 in | Wt 209.2 lb

## 2019-07-11 DIAGNOSIS — S6992XA Unspecified injury of left wrist, hand and finger(s), initial encounter: Secondary | ICD-10-CM | POA: Insufficient documentation

## 2019-07-11 NOTE — Patient Instructions (Signed)
Please proceed with your x-ray

## 2019-07-11 NOTE — Progress Notes (Signed)
Pre visit review using our clinic review tool, if applicable. No additional management support is needed unless otherwise documented below in the visit note. 

## 2019-07-11 NOTE — Progress Notes (Signed)
   Subjective:    Patient ID: Austin Macdonald, male    DOB: [birth year], 64 y.o.   MRN: [id number]  DOS:  07/11/2019 Type of visit - description: Acute Yesterday, he was taking some boxes into his RV and jammed his left fourth finger. Immediately after, he put some ice, developed minimal swelling pain but he is unable to extend the DIP   Review of Systems See above   Past Medical History:  Diagnosis Date  . ADD (attention deficit disorder) 12/30/2015  . Benign paroxysmal positional vertigo 12/26/2014  . Chicken pox as a child  . Colon polyps   . Elevated BP 03/03/2015  . Hay fever    seasonal, spring, fall  . Hyperglycemia 03/03/2015  . Measles as a child  . Mumps as a child  . Preventative health care 07/25/2012  . Tinnitus 12/29/2014    Past Surgical History:  Procedure Laterality Date  . knee arthroscopic repair  2004   right    Allergies as of 07/11/2019   No Known Allergies     Medication List       Accurate as of July 11, 2019 11:47 AM. If you have any questions, ask your nurse or doctor.        ibuprofen 200 MG tablet Commonly known as: Advil Take 1 tablet (200 mg total) by mouth every 6 (six) hours as needed for pain.   loratadine 10 MG tablet Commonly known as: CLARITIN Take 10 mg by mouth daily.   methylphenidate 5 MG tablet Commonly known as: Ritalin Take 1 tablet (5 mg total) by mouth 2 (two) times daily with breakfast and lunch. May 2021          Objective:   Physical Exam BP (!) 149/77 (BP Location: Right Arm, Patient Position: Sitting, Cuff Size: Normal)   Pulse 61   Temp (!) 97.3 F (36.3 C) (Temporal)   Resp 18   Ht 6\' 3"  (1.905 m)   Wt 209 lb 4 oz (94.9 kg)   SpO2 98%   BMI 26.15 kg/m  General:   Well developed, NAD, BMI noted. HEENT:  Normocephalic . Face symmetric, atraumatic MSK Hands and wrists: Normal except for the distal phalanx on the left fourth finger: Unable  active extension Skin: Not pale. Not  jaundice Neurologic:  alert & oriented X3.  Speech normal, gait appropriate for age and unassisted Psych--  Cognition and judgment appear intact.  Cooperative with normal attention span and concentration.  Behavior appropriate. No anxious or depressed appearing.      Assessment    64 year old male, PMH includes hyperglycemia, ADD, allergies, presents with  Injury, fourth left finger, mallet finger.. We will do x-rays, suspect a avulsion fracture. We discussed about a Ortho referral because he is left-hand-dominant however he will leave town in 45 minutes on vacation. For now I recommend to splint the finger with mild hyperextension of the distal phalanx. Further advised with results, he knows will be calling or sending a MyChart message.  This visit occurred during the SARS-CoV-2 public health emergency.  Safety protocols were in place, including screening questions prior to the visit, additional usage of staff PPE, and extensive cleaning of exam room while observing appropriate contact time as indicated for disinfecting solutions.

## 2019-07-30 ENCOUNTER — Other Ambulatory Visit: Payer: Self-pay | Admitting: Family Medicine

## 2019-07-30 ENCOUNTER — Telehealth: Payer: Self-pay | Admitting: Family Medicine

## 2019-07-30 DIAGNOSIS — F988 Other specified behavioral and emotional disorders with onset usually occurring in childhood and adolescence: Secondary | ICD-10-CM

## 2019-07-30 MED ORDER — METHYLPHENIDATE HCL 5 MG PO TABS: 5.0000 mg | ORAL_TABLET | Freq: Two times a day (BID) | ORAL | 0 refills | Status: DC

## 2019-07-30 NOTE — Telephone Encounter (Signed)
Last written: 06/29/19 Last ov: 03/05/19 Next ov:  09/03/19 Contract: will get at next visit UDS: will get at next visit

## 2019-07-30 NOTE — Telephone Encounter (Signed)
done

## 2019-07-30 NOTE — Telephone Encounter (Signed)
Medication:  methylphenidate (RITALIN) 5 MG tablet [[id number]  Has the patient contacted their pharmacy? No. (If no, request that the patient contact the pharmacy for the refill.) (If yes, when and what did the pharmacy advise?)  Preferred Pharmacy (with phone number or street name):  [id number] [address], [place] Phone:  [phone]  Fax:  [phone]      Agent: Please be advised that RX refills may take up to 3 business days. We ask that you follow-up with your pharmacy.

## 2019-08-01 ENCOUNTER — Other Ambulatory Visit: Payer: Self-pay | Admitting: Family Medicine

## 2019-08-01 DIAGNOSIS — F988 Other specified behavioral and emotional disorders with onset usually occurring in childhood and adolescence: Secondary | ICD-10-CM

## 2019-08-01 MED ORDER — METHYLPHENIDATE HCL 5 MG PO TABS: 5.0000 mg | ORAL_TABLET | Freq: Two times a day (BID) | ORAL | 0 refills | Status: DC

## 2019-08-01 NOTE — Telephone Encounter (Signed)
Ritalin went to wrong pharmacy.  Can we resend?  I cancelled the rx at other pharmacy.   I set it to the right pharmacy in [place].  Patient is out of town.

## 2019-08-01 NOTE — Telephone Encounter (Signed)
Patient notified that rx was sent in  

## 2019-08-01 NOTE — Telephone Encounter (Signed)
done

## 2019-09-03 ENCOUNTER — Ambulatory Visit (INDEPENDENT_AMBULATORY_CARE_PROVIDER_SITE_OTHER): Payer: BC Managed Care – PPO | Admitting: Family Medicine

## 2019-09-03 ENCOUNTER — Other Ambulatory Visit: Payer: Self-pay

## 2019-09-03 VITALS — BP 120/80 | HR 60 | Temp 97.8°F | Resp 13 | Ht 73.0 in | Wt 209.0 lb

## 2019-09-03 DIAGNOSIS — M722 Plantar fascial fibromatosis: Secondary | ICD-10-CM

## 2019-09-03 DIAGNOSIS — R739 Hyperglycemia, unspecified: Secondary | ICD-10-CM | POA: Diagnosis not present

## 2019-09-03 DIAGNOSIS — K635 Polyp of colon: Secondary | ICD-10-CM

## 2019-09-03 DIAGNOSIS — I10 Essential (primary) hypertension: Secondary | ICD-10-CM

## 2019-09-03 DIAGNOSIS — R413 Other amnesia: Secondary | ICD-10-CM

## 2019-09-03 DIAGNOSIS — R351 Nocturia: Secondary | ICD-10-CM

## 2019-09-03 DIAGNOSIS — Z79899 Other long term (current) drug therapy: Secondary | ICD-10-CM

## 2019-09-03 DIAGNOSIS — Z1211 Encounter for screening for malignant neoplasm of colon: Secondary | ICD-10-CM

## 2019-09-03 DIAGNOSIS — E782 Mixed hyperlipidemia: Secondary | ICD-10-CM

## 2019-09-03 DIAGNOSIS — F988 Other specified behavioral and emotional disorders with onset usually occurring in childhood and adolescence: Secondary | ICD-10-CM | POA: Diagnosis not present

## 2019-09-03 MED ORDER — METHYLPHENIDATE HCL 5 MG PO TABS: 5.0000 mg | ORAL_TABLET | Freq: Two times a day (BID) | ORAL | 0 refills | Status: DC

## 2019-09-03 NOTE — Assessment & Plan Note (Signed)
Continues to work and travel without difficulty

## 2019-09-03 NOTE — Assessment & Plan Note (Signed)
Last colonoscopy in 2015 he will do an iFOB for work form today today

## 2019-09-03 NOTE — Progress Notes (Signed)
Subjective:    Patient ID: Austin Macdonald, male    DOB: [birth year], 64 y.o.   MRN: [id number]  Chief Complaint  Patient presents with  . Drug / Alcohol Assessment    HPI Patient is in today for follow up on chronic medical concerns. No recent febrile illness or hospitalizations. No acute concerns but his left 4 th finger is improving still mildly painful and stiff but still functions and still numb. His left heel more than his right heel he has tried orthotics, stretching etc and gotten some relief. Right much better but left still notably painful. No fall or injury noted. Denies CP/palp/SOB/HA/congestion/fevers/GI or GU c/o. Taking meds as prescribed  Past Medical History:  Diagnosis Date  . ADD (attention deficit disorder) 12/30/2015  . Benign paroxysmal positional vertigo 12/26/2014  . Chicken pox as a child  . Colon polyps   . Elevated BP 03/03/2015  . Hay fever    seasonal, spring, fall  . Hyperglycemia 03/03/2015  . Measles as a child  . Mumps as a child  . Preventative health care 07/25/2012  . Tinnitus 12/29/2014    Past Surgical History:  Procedure Laterality Date  . knee arthroscopic repair  2004   right    Family History  Problem Relation Age of Onset  . Hypertension Mother   . Heart disease Mother        CHF  . Other Paternal Grandfather        black lung  . Thyroid cancer Daughter   . Dementia Father     Social History   Socioeconomic History  . Marital status: Married    Spouse name: Not on file  . Number of children: Not on file  . Years of education: Not on file  . Highest education level: Not on file  Occupational History  . Not on file  Tobacco Use  . Smoking status: Never Smoker  . Smokeless tobacco: Never Used  Substance and Sexual Activity  . Alcohol use: Yes    Alcohol/week: 0.0 standard drinks    Comment: 10-12 drinks weekly  . Drug use: No  . Sexual activity: Yes    Comment: lives with wife, travels for work [place], no major  dietary restrictions  Other Topics Concern  . Not on file  Social History Narrative  . Not on file   Social Determinants of Health   Financial Resource Strain:   . Difficulty of Paying Living Expenses: Not on file  Food Insecurity:   . Worried About Charity fundraiser in the Last Year: Not on file  . Ran Out of Food in the Last Year: Not on file  Transportation Needs:   . Lack of Transportation (Medical): Not on file  . Lack of Transportation (Non-Medical): Not on file  Physical Activity:   . Days of Exercise per Week: Not on file  . Minutes of Exercise per Session: Not on file  Stress:   . Feeling of Stress : Not on file  Social Connections:   . Frequency of Communication with Friends and Family: Not on file  . Frequency of Social Gatherings with Friends and Family: Not on file  . Attends Religious Services: Not on file  . Active Member of Clubs or Organizations: Not on file  . Attends Archivist Meetings: Not on file  . Marital Status: Not on file  Intimate Partner Violence:   . Fear of Current or Ex-Partner: Not on file  . Emotionally Abused: Not  on file  . Physically Abused: Not on file  . Sexually Abused: Not on file    Outpatient Medications Prior to Visit  Medication Sig Dispense Refill  . ibuprofen (ADVIL) 200 MG tablet Take 1 tablet (200 mg total) by mouth every 6 (six) hours as needed for pain. 30 tablet 0  . loratadine (CLARITIN) 10 MG tablet Take 10 mg by mouth daily.    . methylphenidate (RITALIN) 5 MG tablet Take 1 tablet (5 mg total) by mouth 2 (two) times daily with breakfast and lunch. July 2021 60 tablet 0   No facility-administered medications prior to visit.    No Known Allergies  Review of Systems  Constitutional: Negative for fever and malaise/fatigue.  HENT: Negative for congestion.   Eyes: Negative for blurred vision.  Respiratory: Negative for shortness of breath.   Cardiovascular: Negative for chest pain, palpitations and leg  swelling.  Gastrointestinal: Negative for abdominal pain, blood in stool and nausea.  Genitourinary: Negative for dysuria and frequency.  Musculoskeletal: Negative for falls.  Skin: Negative for rash.  Neurological: Negative for dizziness, loss of consciousness and headaches.  Endo/Heme/Allergies: Negative for environmental allergies.  Psychiatric/Behavioral: Negative for depression. The patient is not nervous/anxious.        Objective:    Physical Exam Vitals and nursing note reviewed.  Constitutional:      General: He is not in acute distress.    Appearance: He is well-developed.  HENT:     Head: Normocephalic and atraumatic.     Nose: Nose normal.  Eyes:     General:        Right eye: No discharge.        Left eye: No discharge.  Cardiovascular:     Rate and Rhythm: Normal rate and regular rhythm.     Heart sounds: No murmur heard.   Pulmonary:     Effort: Pulmonary effort is normal.     Breath sounds: Normal breath sounds.  Abdominal:     General: Bowel sounds are normal.     Palpations: Abdomen is soft.     Tenderness: There is no abdominal tenderness.  Musculoskeletal:     Cervical back: Normal range of motion and neck supple.  Skin:    General: Skin is warm and dry.  Neurological:     Mental Status: He is alert and oriented to person, place, and time.     BP 120/80 (BP Location: Right Arm, Patient Position: Sitting, Cuff Size: Large)   Pulse 60   Temp 97.8 F (36.6 C) (Oral)   Resp 13   Ht 6\' 1"  (1.854 m)   Wt 209 lb (94.8 kg)   SpO2 99%   BMI 27.57 kg/m  Wt Readings from Last 3 Encounters:  09/03/19 209 lb (94.8 kg)  07/11/19 209 lb 4 oz (94.9 kg)  03/05/19 216 lb 6 oz (98.1 kg)    Diabetic Foot Exam - Simple   No data filed     Lab Results  Component Value Date   WBC 4.7 03/05/2019   HGB 14.3 03/05/2019   HCT 42.9 03/05/2019   PLT 264.0 03/05/2019   GLUCOSE 108 (H) 03/05/2019   CHOL 211 (H) 03/05/2019   TRIG 157.0 (H) 03/05/2019   HDL  59.40 03/05/2019   LDLDIRECT 112.0 08/28/2018   LDLCALC 120 (H) 03/05/2019   ALT 9 03/05/2019   AST 17 03/05/2019   NA 140 03/05/2019   K 4.7 03/05/2019   CL 104 03/05/2019   CREATININE  0.78 03/05/2019   BUN 14 03/05/2019   CO2 29 03/05/2019   TSH 1.66 03/05/2019   PSA 1.01 08/28/2018   HGBA1C 5.8 03/05/2019    Lab Results  Component Value Date   TSH 1.66 03/05/2019   Lab Results  Component Value Date   WBC 4.7 03/05/2019   HGB 14.3 03/05/2019   HCT 42.9 03/05/2019   MCV 92.8 03/05/2019   PLT 264.0 03/05/2019   Lab Results  Component Value Date   NA 140 03/05/2019   K 4.7 03/05/2019   CO2 29 03/05/2019   GLUCOSE 108 (H) 03/05/2019   BUN 14 03/05/2019   CREATININE 0.78 03/05/2019   BILITOT 0.7 03/05/2019   ALKPHOS 55 03/05/2019   AST 17 03/05/2019   ALT 9 03/05/2019   PROT 6.6 03/05/2019   ALBUMIN 4.4 03/05/2019   CALCIUM 9.4 03/05/2019   GFR 99.78 03/05/2019   Lab Results  Component Value Date   CHOL 211 (H) 03/05/2019   Lab Results  Component Value Date   HDL 59.40 03/05/2019   Lab Results  Component Value Date   LDLCALC 120 (H) 03/05/2019   Lab Results  Component Value Date   TRIG 157.0 (H) 03/05/2019   Lab Results  Component Value Date   CHOLHDL 4 03/05/2019   Lab Results  Component Value Date   HGBA1C 5.8 03/05/2019       Assessment & Plan:   Problem List Items Addressed This Visit    Colon polyps    Last colonoscopy in 2015 he will do an iFOB for work form today today      Memory difficulties    Continues to work and travel without difficulty      Hyperglycemia - Primary    hgba1c acceptable, minimize simple carbs. Increase exercise as tolerated.       Relevant Orders   Hemoglobin A1c   Comprehensive metabolic panel   TSH   High blood pressure   ADD (attention deficit disorder)   Relevant Medications   methylphenidate (RITALIN) 5 MG tablet   methylphenidate (RITALIN) 5 MG tablet   methylphenidate (RITALIN) 5 MG tablet    Other Relevant Orders   DRUG MONITORING, PANEL 8 WITH CONFIRMATION, URINE   Hyperlipidemia, mixed   Relevant Orders   Lipid panel   TSH   Nocturia   Relevant Orders   PSA   CBC    Other Visit Diagnoses    Plantar fasciitis, bilateral       Relevant Orders   Ambulatory referral to Podiatry   High risk medication use       Relevant Orders   DRUG MONITORING, PANEL 8 WITH CONFIRMATION, URINE      I have changed Nehemiah Massed "Walt"'s methylphenidate. I am also having him start on methylphenidate and methylphenidate. Additionally, I am having him maintain his loratadine and ibuprofen.  Meds ordered this encounter  Medications  . methylphenidate (RITALIN) 5 MG tablet    Sig: Take 1 tablet (5 mg total) by mouth 2 (two) times daily with breakfast and lunch. August 2021    Dispense:  60 tablet    Refill:  0  . methylphenidate (RITALIN) 5 MG tablet    Sig: Take 1 tablet (5 mg total) by mouth 2 (two) times daily with breakfast and lunch. September 2021    Dispense:  60 tablet    Refill:  0  . methylphenidate (RITALIN) 5 MG tablet    Sig: Take 1 tablet (5 mg total) by  mouth 2 (two) times daily with breakfast and lunch. October 2021    Dispense:  60 tablet    Refill:  0     Penni Homans, MD

## 2019-09-03 NOTE — Assessment & Plan Note (Signed)
Ice, stretch, lidocaine, orthotics and referred to podiatry for definitive care. He did have a steroid shot in the left foot which helped temporarily.

## 2019-09-03 NOTE — Patient Instructions (Addendum)
Try massaging finger with lidocaine gel  Call Dr Watt Climes for colonoscopy Colon Polyps  Polyps are tissue growths inside the body. Polyps can grow in many places, including the large intestine (colon). A polyp may be a round bump or a mushroom-shaped growth. You could have one polyp or several. Most colon polyps are noncancerous (benign). However, some colon polyps can become cancerous over time. Finding and removing the polyps early can help prevent this. What are the causes? The exact cause of colon polyps is not known. What increases the risk? You are more likely to develop this condition if you:  Have a family history of colon cancer or colon polyps.  Are older than 64 or older than 45 if you are African American.  Have inflammatory bowel disease, such as ulcerative colitis or Crohn's disease.  Have certain hereditary conditions, such as: ? Familial adenomatous polyposis. ? Lynch syndrome. ? Turcot syndrome. ? Peutz-Jeghers syndrome.  Are overweight.  Smoke cigarettes.  Do not get enough exercise.  Drink too much alcohol.  Eat a diet that is high in fat and red meat and low in fiber.  Had childhood cancer that was treated with abdominal radiation. What are the signs or symptoms? Most polyps do not cause symptoms. If you have symptoms, they may include:  Blood coming from your rectum when having a bowel movement.  Blood in your stool. The stool may look dark red or black.  Abdominal pain.  A change in bowel habits, such as constipation or diarrhea. How is this diagnosed? This condition is diagnosed with a colonoscopy. This is a procedure in which a lighted, flexible scope is inserted into the anus and then passed into the colon to examine the area. Polyps are sometimes found when a colonoscopy is done as part of routine cancer screening tests. How is this treated? Treatment for this condition involves removing any polyps that are found. Most polyps can be removed  during a colonoscopy. Those polyps will then be tested for cancer. Additional treatment may be needed depending on the results of testing. Follow these instructions at home: Lifestyle  Maintain a healthy weight, or lose weight if recommended by your health care provider.  Exercise every day or as told by your health care provider.  Do not use any products that contain nicotine or tobacco, such as cigarettes and e-cigarettes. If you need help quitting, ask your health care provider.  If you drink alcohol, limit how much you have: ? 0-1 drink a day for women. ? 0-2 drinks a day for men.  Be aware of how much alcohol is in your drink. In the [place] one drink equals one 12 oz bottle of beer (355 mL), one 5 oz glass of wine (148 mL), or one 1 oz shot of hard liquor (44 mL). Eating and drinking   Eat foods that are high in fiber, such as fruits, vegetables, and whole grains.  Eat foods that are high in calcium and vitamin D, such as milk, cheese, yogurt, eggs, liver, fish, and broccoli.  Limit foods that are high in fat, such as fried foods and desserts.  Limit the amount of red meat and processed meat you eat, such as hot dogs, sausage, bacon, and lunch meats. General instructions  Keep all follow-up visits as told by your health care provider. This is important. ? This includes having regularly scheduled colonoscopies. ? Talk to your health care provider about when you need a colonoscopy. Contact a health care provider if:  You have new or worsening bleeding during a bowel movement.  You have new or increased blood in your stool.  You have a change in bowel habits.  You lose weight for no known reason. Summary  Polyps are tissue growths inside the body. Polyps can grow in many places, including the colon.  Most colon polyps are noncancerous (benign), but some can become cancerous over time.  This condition is diagnosed with a colonoscopy.  Treatment for this condition  involves removing any polyps that are found. Most polyps can be removed during a colonoscopy. This information is not intended to replace advice given to you by your health care provider. Make sure you discuss any questions you have with your health care provider. Document Revised: 04/14/2017 Document Reviewed: 04/14/2017 Elsevier Patient Education  [place].

## 2019-09-03 NOTE — Assessment & Plan Note (Signed)
hgba1c acceptable, minimize simple carbs. Increase exercise as tolerated.  

## 2019-09-04 ENCOUNTER — Ambulatory Visit (INDEPENDENT_AMBULATORY_CARE_PROVIDER_SITE_OTHER): Payer: BC Managed Care – PPO

## 2019-09-04 ENCOUNTER — Ambulatory Visit: Payer: BC Managed Care – PPO | Admitting: Podiatry

## 2019-09-04 DIAGNOSIS — M722 Plantar fascial fibromatosis: Secondary | ICD-10-CM | POA: Diagnosis not present

## 2019-09-04 DIAGNOSIS — M79672 Pain in left foot: Secondary | ICD-10-CM

## 2019-09-04 DIAGNOSIS — M779 Enthesopathy, unspecified: Secondary | ICD-10-CM

## 2019-09-04 DIAGNOSIS — M79671 Pain in right foot: Secondary | ICD-10-CM

## 2019-09-04 LAB — CBC
HCT: 42.1 % (ref 38.5–50.0)
Hemoglobin: 13.9 g/dL (ref 13.2–17.1)
MCH: 30.8 pg (ref 27.0–33.0)
MCHC: 33 g/dL (ref 32.0–36.0)
MCV: 93.1 fL (ref 80.0–100.0)
MPV: 10.7 fL (ref 7.5–12.5)
Platelets: 274 10*3/uL (ref 140–400)
RBC: 4.52 10*6/uL (ref 4.20–5.80)
RDW: 12 % (ref 11.0–15.0)
WBC: 4.7 10*3/uL (ref 3.8–10.8)

## 2019-09-04 LAB — DRUG MONITORING, PANEL 8 WITH CONFIRMATION, URINE
6 Acetylmorphine: NEGATIVE ng/mL (ref <10)
Alcohol Metabolites: NEGATIVE ng/mL
Amphetamines: NEGATIVE ng/mL (ref <500)
Benzodiazepines: NEGATIVE ng/mL (ref <100)
Buprenorphine, Urine: NEGATIVE ng/mL (ref <5)
Cocaine Metabolite: NEGATIVE ng/mL (ref <150)
Creatinine: 23.4 mg/dL
MDMA: NEGATIVE ng/mL (ref <500)
Marijuana Metabolite: NEGATIVE ng/mL (ref <20)
Opiates: NEGATIVE ng/mL (ref <100)
Oxidant: NEGATIVE ug/mL
Oxycodone: NEGATIVE ng/mL (ref <100)
pH: 7.1 (ref 4.5–9.0)

## 2019-09-04 LAB — COMPREHENSIVE METABOLIC PANEL
AG Ratio: 2 (calc) (ref 1.0–2.5)
ALT: 8 U/L — ABNORMAL LOW (ref 9–46)
AST: 17 U/L (ref 10–35)
Albumin: 4.1 g/dL (ref 3.6–5.1)
Alkaline phosphatase (APISO): 48 U/L (ref 35–144)
BUN: 18 mg/dL (ref 7–25)
CO2: 29 mmol/L (ref 20–32)
Calcium: 8.9 mg/dL (ref 8.6–10.3)
Chloride: 105 mmol/L (ref 98–110)
Creat: 0.74 mg/dL (ref 0.70–1.25)
Globulin: 2.1 g/dL (calc) (ref 1.9–3.7)
Glucose, Bld: 101 mg/dL — ABNORMAL HIGH (ref 65–99)
Potassium: 4.8 mmol/L (ref 3.5–5.3)
Sodium: 140 mmol/L (ref 135–146)
Total Bilirubin: 1 mg/dL (ref 0.2–1.2)
Total Protein: 6.2 g/dL (ref 6.1–8.1)

## 2019-09-04 LAB — LIPID PANEL
Cholesterol: 186 mg/dL (ref <200)
HDL: 52 mg/dL (ref >=40)
LDL Cholesterol (Calc): 111 mg/dL (calc) — ABNORMAL HIGH
Non-HDL Cholesterol (Calc): 134 mg/dL (calc) — ABNORMAL HIGH (ref <130)
Total CHOL/HDL Ratio: 3.6 (calc) (ref <5.0)
Triglycerides: 122 mg/dL (ref <150)

## 2019-09-04 LAB — HEMOGLOBIN A1C
Hgb A1c MFr Bld: 5.6 % of total Hgb (ref <5.7)
Mean Plasma Glucose: 114 (calc)
eAG (mmol/L): 6.3 (calc)

## 2019-09-04 LAB — DM TEMPLATE

## 2019-09-04 LAB — TSH: TSH: 1.47 mIU/L (ref 0.40–4.50)

## 2019-09-04 LAB — PSA: PSA: 0.9 ng/mL (ref <=4.0)

## 2019-09-04 MED ORDER — MELOXICAM 15 MG PO TABS: 15.0000 mg | ORAL_TABLET | Freq: Every day | ORAL | 0 refills | Status: DC

## 2019-09-04 NOTE — Progress Notes (Signed)
3/4 dress foot orthotic hug arch neutral post

## 2019-09-11 NOTE — Progress Notes (Signed)
Subjective:   Patient ID: Austin Macdonald, male   DOB: 64 y.o.   MRN: [id number]   HPI 64 year old male presents the office today for concerns of left foot pain. Reports 1 to lateral aspect of foot on sinus tarsi he gets majority discomfort. He was getting heel pain on the right side again injection performed he states that he was 95% better. That started back in March 2021. He feels that the bones on the left side "do not match up" to the right. Denies any recent injury or trauma. No severe swelling or radiating pain or weakness.   Review of Systems  All other systems reviewed and are negative.  Past Medical History:  Diagnosis Date  . ADD (attention deficit disorder) 12/30/2015  . Benign paroxysmal positional vertigo 12/26/2014  . Chicken pox as a child  . Colon polyps   . Elevated BP 03/03/2015  . Hay fever    seasonal, spring, fall  . Hyperglycemia 03/03/2015  . Measles as a child  . Mumps as a child  . Preventative health care 07/25/2012  . Tinnitus 12/29/2014    Past Surgical History:  Procedure Laterality Date  . knee arthroscopic repair  2004   right     Current Outpatient Medications:  .  ibuprofen (ADVIL) 200 MG tablet, Take 1 tablet (200 mg total) by mouth every 6 (six) hours as needed for pain., Disp: 30 tablet, Rfl: 0 .  loratadine (CLARITIN) 10 MG tablet, Take 10 mg by mouth daily., Disp: , Rfl:  .  meloxicam (MOBIC) 15 MG tablet, Take 1 tablet (15 mg total) by mouth daily., Disp: 30 tablet, Rfl: 0 .  methylphenidate (RITALIN) 5 MG tablet, Take 1 tablet (5 mg total) by mouth 2 (two) times daily with breakfast and lunch. August 2021, Disp: 60 tablet, Rfl: 0 .  methylphenidate (RITALIN) 5 MG tablet, Take 1 tablet (5 mg total) by mouth 2 (two) times daily with breakfast and lunch. September 2021, Disp: 60 tablet, Rfl: 0 .  methylphenidate (RITALIN) 5 MG tablet, Take 1 tablet (5 mg total) by mouth 2 (two) times daily with breakfast and lunch. October 2021, Disp: 60  tablet, Rfl: 0  No Known Allergies        Objective:  Physical Exam  General: AAO x3, NAD  Dermatological: Skin is warm, dry and supple bilateral. There are no open sores, no preulcerative lesions, no rash or signs of infection present.  Vascular: Dorsalis Pedis artery and Posterior Tibial artery pedal pulses are 2/4 bilateral with immedate capillary fill time. There is no pain with calf compression, swelling, warmth, erythema.   Neruologic: Grossly intact via light touch bilateral.   Musculoskeletal: At this time there is no significant discomfort to palpation to the course or insertion of plantar fascia. The left side there is discomfort on the course of the sinus tarsi. There is no edema, erythema. Subtalar joint range of motion intact but any restrictions. Mild decrease in medial arch height upon weightbearing. Muscular strength 5/5 in all groups tested bilateral.  Gait: Unassisted, Nonantalgic.       Assessment:   Left foot capsulitis; resolving plantar fasciitis    Plan:  -Treatment options discussed including all alternatives, risks, and complications -Etiology of symptoms were discussed -X-rays were obtained and reviewed with the patient. There is no evidence of acute fracture or stress fracture identified today. -Steroid injection performed to the sinus tarsi of the left foot. See procedure note below. -Measure for orthotics today -Discussed shoe modifications -  Prescribed mobic. Discussed side effects of the medication and directed to stop if any are to occur and call the office.   Procedure: Injection intermediate joint Discussed alternatives, risks, complications and verbal consent was obtained.  Location: Left sinus tarsi Skin Prep: Betadine  Injectate: 0.5cc 0.5% marcaine plain, 0.5 cc 2% lidocaine plain and, 1 cc kenalog 10. Disposition: Patient tolerated procedure well. Injection site dressed with a band-aid.  Post-injection care was discussed and return  precautions discussed.   Return in about 4 weeks (around 10/02/2019).  Trula Slade DPM

## 2019-09-12 ENCOUNTER — Other Ambulatory Visit (INDEPENDENT_AMBULATORY_CARE_PROVIDER_SITE_OTHER): Payer: BC Managed Care – PPO

## 2019-09-12 DIAGNOSIS — K635 Polyp of colon: Secondary | ICD-10-CM | POA: Diagnosis not present

## 2019-09-12 DIAGNOSIS — Z1211 Encounter for screening for malignant neoplasm of colon: Secondary | ICD-10-CM

## 2019-09-12 LAB — FECAL OCCULT BLOOD, IMMUNOCHEMICAL: Fecal Occult Bld: NEGATIVE

## 2019-09-12 NOTE — Addendum Note (Signed)
Addended by: Kelle Darting A on: 09/12/2019 09:08 AM   Modules accepted: Orders

## 2019-10-08 ENCOUNTER — Other Ambulatory Visit: Payer: Self-pay

## 2019-10-08 ENCOUNTER — Ambulatory Visit: Payer: BC Managed Care – PPO | Admitting: Orthotics

## 2019-10-08 DIAGNOSIS — M722 Plantar fascial fibromatosis: Secondary | ICD-10-CM

## 2019-10-08 DIAGNOSIS — M79672 Pain in left foot: Secondary | ICD-10-CM

## 2019-10-08 DIAGNOSIS — M779 Enthesopathy, unspecified: Secondary | ICD-10-CM

## 2019-10-08 NOTE — Progress Notes (Signed)
Patient came in today to pick up custom made foot orthotics.  The goals were accomplished and the patient reported no dissatisfaction with said orthotics.  Patient was advised of breakin period and how to report any issues. 

## 2019-11-13 ENCOUNTER — Other Ambulatory Visit: Payer: Self-pay

## 2019-11-13 ENCOUNTER — Ambulatory Visit: Payer: BC Managed Care – PPO | Admitting: Podiatry

## 2019-11-13 DIAGNOSIS — M205X9 Other deformities of toe(s) (acquired), unspecified foot: Secondary | ICD-10-CM | POA: Diagnosis not present

## 2019-11-13 DIAGNOSIS — M779 Enthesopathy, unspecified: Secondary | ICD-10-CM

## 2019-11-13 DIAGNOSIS — M79672 Pain in left foot: Secondary | ICD-10-CM | POA: Diagnosis not present

## 2019-11-14 NOTE — Progress Notes (Signed)
Subjective: 64 year old male presents the office for follow evaluation of left foot pain.  States the steroid injection was helpful the orthotics have also been helpful.  He gets some stiffness in the morning when he first gets up and as he starts to walk it feels better.  He denies any present injury or fall since I last saw him.  He states that this pain was self-inflicted when he wore a pair of inserts which were not helpful.  He wants to do something to try to help prevent any future problems.  Also secondary concerns of the fifth toes curl under the fourth toes causing irritation when he is active. Denies any systemic complaints such as fevers, chills, nausea, vomiting. No acute changes since last appointment, and no other complaints at this time.   Objective: AAO x3, NAD DP/PT pulses palpable bilaterally, CRT less than 3 seconds At this time there is minimal discomfort in the sinus tarsi.  There is no pain on the peroneal tendons, flexor, extensor tendons.  MMT 5/5.  No edema, erythema.  Adductovarus present fifth toes underlapping left fourth digits.  There is no open lesions.  No pain with calf compression, swelling, warmth, erythema  Assessment: Capsulitis left foot, adductovarus toe  Plan: -All treatment options discussed with the patient including all alternatives, risks, complications.  -In regards to the foot pain we discussed stretching exercises daily to help with mobility.  Continue orthotics as well as supportive shoes.  He can use Voltaren gel as needed.  The Mobic was helpful as well but he wants to try to hold off on continuing oral medications. -Dispensed toe separator for the fourth and fifth toes. -Patient encouraged to call the office with any questions, concerns, change in symptoms.   Trula Slade DPM

## 2019-12-10 ENCOUNTER — Telehealth: Payer: Self-pay | Admitting: Family Medicine

## 2019-12-10 NOTE — Telephone Encounter (Signed)
Medication:  methylphenidate (RITALIN) 5 MG tablet [[id number]]   Has the patient contacted their pharmacy? No. (If no, request that the patient contact the pharmacy for the refill.) (If yes, when and what did the pharmacy advise?)  Preferred Pharmacy (with phone number or street name): [id number] - [place], [address], [place] [address]  Phone:  [phone] Fax:  [phone]  DEA #:  [id number]  Agent: Please be advised that RX refills may take up to 3 business days. We ask that you follow-up with your pharmacy.

## 2019-12-11 ENCOUNTER — Other Ambulatory Visit: Payer: Self-pay | Admitting: Family Medicine

## 2019-12-11 DIAGNOSIS — F988 Other specified behavioral and emotional disorders with onset usually occurring in childhood and adolescence: Secondary | ICD-10-CM

## 2019-12-11 MED ORDER — METHYLPHENIDATE HCL 5 MG PO TABS: 5.0000 mg | ORAL_TABLET | Freq: Two times a day (BID) | ORAL | 0 refills | Status: DC

## 2019-12-11 NOTE — Telephone Encounter (Signed)
done

## 2019-12-11 NOTE — Telephone Encounter (Signed)
Requesting:methylphenidate (RITALIN) 5 MG tablet [[id number]]  Contract:09/03/19 UDS:09/03/19 Last Visit:09/03/19 Next Visit:none Last Refill:09/03/19  Please Advise

## 2020-01-08 ENCOUNTER — Telehealth: Payer: Self-pay | Admitting: Family Medicine

## 2020-01-08 NOTE — Telephone Encounter (Signed)
Medication: methylphenidate (RITALIN) 5 MG tablet [[id number]]       Has the patient contacted their pharmacy?  (If no, request that the patient contact the pharmacy for the refill.) (If yes, when and what did the pharmacy advise?)     Preferred Pharmacy (with phone number or street name):[id number] - [place], [place] - [address]  [address], [place] [address]  Phone:  [phone] Fax:  [phone]      Agent: Please be advised that RX refills may take up to 3 business days. We ask that you follow-up with your pharmacy.  

## 2020-01-08 NOTE — Telephone Encounter (Signed)
3 Rx's sent on 12/11/2019 to [facility]- already on file.

## 2020-02-11 ENCOUNTER — Telehealth: Payer: Self-pay | Admitting: Family Medicine

## 2020-02-11 NOTE — Telephone Encounter (Signed)
February 2022 refill already on file at [facility].

## 2020-02-11 NOTE — Telephone Encounter (Signed)
Medication: methylphenidate (RITALIN) 5 MG tablet    Has the patient contacted their pharmacy? No. (If no, request that the patient contact the pharmacy for the refill.) (If yes, when and what did the pharmacy advise?)  Preferred Pharmacy (with phone number or street name):  [id number] - [place], [address]  [address], [place] [address]  Phone:  [phone] Fax:  [phone]  DEA #:  [id number]  DAW Reason: --    Agent: Please be advised that RX refills may take up to 3 business days. We ask that you follow-up with your pharmacy.

## 2020-03-14 ENCOUNTER — Telehealth: Payer: Self-pay | Admitting: Family Medicine

## 2020-03-14 ENCOUNTER — Other Ambulatory Visit: Payer: Self-pay | Admitting: Family Medicine

## 2020-03-14 DIAGNOSIS — F988 Other specified behavioral and emotional disorders with onset usually occurring in childhood and adolescence: Secondary | ICD-10-CM

## 2020-03-14 MED ORDER — METHYLPHENIDATE HCL 5 MG PO TABS: 5.0000 mg | ORAL_TABLET | Freq: Two times a day (BID) | ORAL | 0 refills | Status: DC

## 2020-03-14 NOTE — Telephone Encounter (Signed)
I sent in the march prescription, will send in more when I see him next week.

## 2020-03-14 NOTE — Telephone Encounter (Signed)
Left message on machine that rx was sent in.  

## 2020-03-14 NOTE — Telephone Encounter (Addendum)
Requesting: Ritalin Contract: 09/03/19 UDS: 09/03/19 Last Visit: 09/03/19 Next Visit: 03/19/19 Last Refill: 12/11/19 (was written for 3 rxs)  Please Advise

## 2020-03-14 NOTE — Telephone Encounter (Signed)
Medication: methylphenidate (RITALIN) 5 MG tablet [[id number]]       Has the patient contacted their pharmacy?  (If no, request that the patient contact the pharmacy for the refill.) (If yes, when and what did the pharmacy advise?)     Preferred Pharmacy (with phone number or street name):[id number] - [place], [address]  [address], [place] [address]  Phone:  [phone] Fax:  [phone]      Agent: Please be advised that RX refills may take up to 3 business days. We ask that you follow-up with your pharmacy.

## 2020-03-18 ENCOUNTER — Telehealth: Payer: Self-pay

## 2020-03-18 ENCOUNTER — Telehealth (INDEPENDENT_AMBULATORY_CARE_PROVIDER_SITE_OTHER): Payer: 59 | Admitting: Family Medicine

## 2020-03-18 ENCOUNTER — Other Ambulatory Visit: Payer: Self-pay

## 2020-03-18 DIAGNOSIS — R739 Hyperglycemia, unspecified: Secondary | ICD-10-CM | POA: Diagnosis not present

## 2020-03-18 DIAGNOSIS — F988 Other specified behavioral and emotional disorders with onset usually occurring in childhood and adolescence: Secondary | ICD-10-CM | POA: Diagnosis not present

## 2020-03-18 DIAGNOSIS — E782 Mixed hyperlipidemia: Secondary | ICD-10-CM | POA: Diagnosis not present

## 2020-03-18 MED ORDER — METHYLPHENIDATE HCL 5 MG PO TABS: 5.0000 mg | ORAL_TABLET | Freq: Two times a day (BID) | ORAL | 0 refills | Status: DC

## 2020-03-18 NOTE — Progress Notes (Signed)
Patient ID: Austin Macdonald, male    DOB: 13-Jan-1955  Age: 65 y.o. MRN: [id number]   Virtual Visit via Video Note  I connected with Austin Macdonald on 03/18/20 at  2:40 PM EST by a video enabled telemedicine application and verified that I am speaking with the correct person using two identifiers.  Location: Patient: home, patient and provider are in visit Provider: home   I discussed the limitations of evaluation and management by telemedicine and the availability of in person appointments. The patient expressed understanding and agreed to proceed. Nena Alexander, CMA was able to get the patient set up on a video visit   Subjective:  Subjective  HPI Austin Macdonald presents for virtual visit today. He is feeling well today. No recent febrile illness or hospitalizations. He is doing well on his low dose Ritalin and he denies any concerning side effects. He continues to work and the Ritalin helps him complete his tasks. He continues to stay active, exercise and eat a heart healthy diet. Denies CP/palp/SOB/HA/congestion/fevers/GI or GU c/o. Taking meds as prescribed  Review of Systems  Constitutional: Negative for chills, diaphoresis, fatigue and fever.  HENT: Negative for congestion, ear discharge, ear pain, rhinorrhea, sinus pressure, sinus pain and sore throat.   Eyes: Negative for pain.  Respiratory: Negative for cough, chest tightness and shortness of breath.   Cardiovascular: Negative for chest pain, palpitations and leg swelling.  Gastrointestinal: Negative for abdominal pain, blood in stool, diarrhea, nausea and vomiting.  Genitourinary: Negative for difficulty urinating, dysuria, flank pain, hematuria, testicular pain and urgency.  Musculoskeletal: Negative for back pain, joint swelling and neck pain.  Skin: Negative for rash.  Neurological: Negative for headaches.    History Past Medical History:  Diagnosis Date  . ADD (attention deficit disorder) 12/30/2015  . Benign  paroxysmal positional vertigo 12/26/2014  . Chicken pox as a child  . Colon polyps   . Elevated BP 03/03/2015  . Hay fever    seasonal, spring, fall  . Hyperglycemia 03/03/2015  . Measles as a child  . Mumps as a child  . Preventative health care 07/25/2012  . Tinnitus 12/29/2014    He has a past surgical history that includes knee arthroscopic repair (2004).   His family history includes Dementia in his father; Heart disease in his mother; Hypertension in his mother; Other in his paternal grandfather; Thyroid cancer in his daughter.He reports that he has never smoked. He has never used smokeless tobacco. He reports current alcohol use. He reports that he does not use drugs.  Current Outpatient Medications on File Prior to Visit  Medication Sig Dispense Refill  . ibuprofen (ADVIL) 200 MG tablet Take 1 tablet (200 mg total) by mouth every 6 (six) hours as needed for pain. 30 tablet 0  . loratadine (CLARITIN) 10 MG tablet Take 10 mg by mouth daily.    . meloxicam (MOBIC) 15 MG tablet Take 1 tablet (15 mg total) by mouth daily. 30 tablet 0  . methylphenidate (RITALIN) 5 MG tablet Take 1 tablet (5 mg total) by mouth 2 (two) times daily with breakfast and lunch. December 2021 60 tablet 0  . methylphenidate (RITALIN) 5 MG tablet Take 1 tablet (5 mg total) by mouth 2 (two) times daily with breakfast and lunch. Feb 2022 60 tablet 0  . methylphenidate (RITALIN) 5 MG tablet Take 1 tablet (5 mg total) by mouth 2 (two) times daily with breakfast and lunch. March 2022 60 tablet 0   No  current facility-administered medications on file prior to visit.     Objective:  Objective  Physical Exam Constitutional:      Appearance: Normal appearance. He is not ill-appearing.  HENT:     Head: Normocephalic and atraumatic.     Right Ear: External ear normal.     Left Ear: External ear normal.     Nose: Nose normal.  Eyes:     General:        Right eye: No discharge.        Left eye: No discharge.   Pulmonary:     Effort: Pulmonary effort is normal.  Neurological:     Mental Status: He is alert and oriented to person, place, and time.  Psychiatric:        Behavior: Behavior normal.    There were no vitals taken for this visit. Wt Readings from Last 3 Encounters:  09/03/19 209 lb (94.8 kg)  07/11/19 209 lb 4 oz (94.9 kg)  03/05/19 216 lb 6 oz (98.1 kg)     Lab Results  Component Value Date   WBC 4.7 09/03/2019   HGB 13.9 09/03/2019   HCT 42.1 09/03/2019   PLT 274 09/03/2019   GLUCOSE 101 (H) 09/03/2019   CHOL 186 09/03/2019   TRIG 122 09/03/2019   HDL 52 09/03/2019   LDLDIRECT 112.0 08/28/2018   LDLCALC 111 (H) 09/03/2019   ALT 8 (L) 09/03/2019   AST 17 09/03/2019   NA 140 09/03/2019   K 4.8 09/03/2019   CL 105 09/03/2019   CREATININE 0.74 09/03/2019   BUN 18 09/03/2019   CO2 29 09/03/2019   TSH 1.47 09/03/2019   PSA 0.9 09/03/2019   HGBA1C 5.6 09/03/2019    DG Finger Ring Left  Result Date: 07/11/2019 CLINICAL DATA:  Jammed finger yesterday with pain, initial encounter EXAM: LEFT RING FINGER 2+V COMPARISON:  None. FINDINGS: There is no evidence of fracture or dislocation. There is no evidence of arthropathy or other focal bone abnormality. Soft tissues are unremarkable. IMPRESSION: No acute abnormality noted. Electronically Signed   By: Inez Catalina M.D.   On: 07/11/2019 16:59     Assessment & Plan:  Plan    No orders of the defined types were placed in this encounter.   Problem List Items Addressed This Visit   None     Follow-up: No follow-ups on file. I discussed the assessment and treatment plan with the patient. The patient was provided an opportunity to ask questions and all were answered. The patient agreed with the plan and demonstrated an understanding of the instructions.   The patient was advised to call back or seek an in-person evaluation if the symptoms worsen or if the condition fails to improve as anticipated.  I provided 15  minutes of non-face-to-face time during this encounter.   Penni Homans, MD

## 2020-03-18 NOTE — Telephone Encounter (Signed)
Pt has two physicals scheduled would like to know which one he would prefer.

## 2020-03-19 NOTE — Assessment & Plan Note (Signed)
hgba1c acceptable, minimize simple carbs. Increase exercise as tolerated.  

## 2020-03-19 NOTE — Assessment & Plan Note (Signed)
He is doing well on his low dose of Ritalin. Refills are given today

## 2020-03-19 NOTE — Assessment & Plan Note (Signed)
Maintain heart healthy diet, increase exercise, avoid trans fats, consider a krill oil cap daily

## 2020-05-15 ENCOUNTER — Telehealth: Payer: Self-pay | Admitting: Family Medicine

## 2020-05-15 NOTE — Telephone Encounter (Signed)
Spoke with pt let him know that she did recommend the booster

## 2020-05-15 NOTE — Telephone Encounter (Signed)
*   the second covid booster

## 2020-05-15 NOTE — Telephone Encounter (Signed)
Patient would like know if Dr Charlett Blake would recommend the Covid booster

## 2020-07-18 ENCOUNTER — Other Ambulatory Visit: Payer: Self-pay | Admitting: Family Medicine

## 2020-07-18 ENCOUNTER — Telehealth: Payer: Self-pay | Admitting: Family Medicine

## 2020-07-18 DIAGNOSIS — F988 Other specified behavioral and emotional disorders with onset usually occurring in childhood and adolescence: Secondary | ICD-10-CM

## 2020-07-18 MED ORDER — METHYLPHENIDATE HCL 5 MG PO TABS: 5.0000 mg | ORAL_TABLET | Freq: Two times a day (BID) | ORAL | 0 refills | Status: DC

## 2020-07-18 NOTE — Telephone Encounter (Signed)
Requesting: ritalin Contract: 09/03/19 UDS: 09/03/19 Last Visit: 03/18/20 Next Visit: 08/21/20 Last Refill: 03/18/20 (3rxs)  Please Advise

## 2020-07-18 NOTE — Telephone Encounter (Signed)
Medication: methylphenidate (RITALIN) 5 MG tablet   Has the patient contacted their pharmacy? Yes.   (If no, request that the patient contact the pharmacy for the refill.) (If yes, when and what did the pharmacy advise?)  Preferred Pharmacy (with phone number or street name):  [id number] [address], [place] Phone:  [phone]  Fax:  [phone]      Agent: Please be advised that RX refills may take up to 3 business days. We ask that you follow-up with your pharmacy.

## 2020-08-12 ENCOUNTER — Encounter: Payer: BC Managed Care – PPO | Admitting: Family Medicine

## 2020-08-21 ENCOUNTER — Encounter: Payer: BC Managed Care – PPO | Admitting: Family Medicine

## 2020-08-22 ENCOUNTER — Ambulatory Visit (INDEPENDENT_AMBULATORY_CARE_PROVIDER_SITE_OTHER): Payer: 59 | Admitting: Family Medicine

## 2020-08-22 ENCOUNTER — Other Ambulatory Visit: Payer: Self-pay

## 2020-08-22 ENCOUNTER — Encounter: Payer: Self-pay | Admitting: Family Medicine

## 2020-08-22 VITALS — BP 122/84 | HR 61 | Temp 98.1°F | Resp 16 | Ht 73.0 in | Wt 213.2 lb

## 2020-08-22 DIAGNOSIS — M7732 Calcaneal spur, left foot: Secondary | ICD-10-CM

## 2020-08-22 DIAGNOSIS — K635 Polyp of colon: Secondary | ICD-10-CM | POA: Diagnosis not present

## 2020-08-22 DIAGNOSIS — E782 Mixed hyperlipidemia: Secondary | ICD-10-CM | POA: Diagnosis not present

## 2020-08-22 DIAGNOSIS — F988 Other specified behavioral and emotional disorders with onset usually occurring in childhood and adolescence: Secondary | ICD-10-CM | POA: Diagnosis not present

## 2020-08-22 DIAGNOSIS — R413 Other amnesia: Secondary | ICD-10-CM

## 2020-08-22 DIAGNOSIS — M79644 Pain in right finger(s): Secondary | ICD-10-CM

## 2020-08-22 DIAGNOSIS — Z0000 Encounter for general adult medical examination without abnormal findings: Secondary | ICD-10-CM | POA: Diagnosis not present

## 2020-08-22 DIAGNOSIS — R739 Hyperglycemia, unspecified: Secondary | ICD-10-CM | POA: Diagnosis not present

## 2020-08-22 LAB — LIPID PANEL
Cholesterol: 188 mg/dL (ref 0–200)
HDL: 57 mg/dL (ref >39.00)
LDL Cholesterol: 109 mg/dL — ABNORMAL HIGH (ref 0–99)
NonHDL: 131.22
Total CHOL/HDL Ratio: 3
Triglycerides: 110 mg/dL (ref 0.0–149.0)
VLDL: 22 mg/dL (ref 0.0–40.0)

## 2020-08-22 LAB — CBC WITH DIFFERENTIAL/PLATELET
Basophils Absolute: 0 10*3/uL (ref 0.0–0.1)
Basophils Relative: 1 % (ref 0.0–3.0)
Eosinophils Absolute: 0.1 10*3/uL (ref 0.0–0.7)
Eosinophils Relative: 2.2 % (ref 0.0–5.0)
HCT: 41.7 % (ref 39.0–52.0)
Hemoglobin: 13.9 g/dL (ref 13.0–17.0)
Lymphocytes Relative: 27.8 % (ref 12.0–46.0)
Lymphs Abs: 1.3 10*3/uL (ref 0.7–4.0)
MCHC: 33.3 g/dL (ref 30.0–36.0)
MCV: 92.6 fl (ref 78.0–100.0)
Monocytes Absolute: 0.4 10*3/uL (ref 0.1–1.0)
Monocytes Relative: 9 % (ref 3.0–12.0)
Neutro Abs: 2.8 10*3/uL (ref 1.4–7.7)
Neutrophils Relative %: 60 % (ref 43.0–77.0)
Platelets: 251 10*3/uL (ref 150.0–400.0)
RBC: 4.5 Mil/uL (ref 4.22–5.81)
RDW: 12.5 % (ref 11.5–15.5)
WBC: 4.6 10*3/uL (ref 4.0–10.5)

## 2020-08-22 LAB — COMPREHENSIVE METABOLIC PANEL
ALT: 8 U/L (ref 0–53)
AST: 15 U/L (ref 0–37)
Albumin: 4.1 g/dL (ref 3.5–5.2)
Alkaline Phosphatase: 48 U/L (ref 39–117)
BUN: 13 mg/dL (ref 6–23)
CO2: 28 mEq/L (ref 19–32)
Calcium: 8.9 mg/dL (ref 8.4–10.5)
Chloride: 104 mEq/L (ref 96–112)
Creatinine, Ser: 0.74 mg/dL (ref 0.40–1.50)
GFR: 94.2 mL/min (ref >60.00)
Glucose, Bld: 95 mg/dL (ref 70–99)
Potassium: 4.4 mEq/L (ref 3.5–5.1)
Sodium: 140 mEq/L (ref 135–145)
Total Bilirubin: 1 mg/dL (ref 0.2–1.2)
Total Protein: 6.3 g/dL (ref 6.0–8.3)

## 2020-08-22 LAB — TSH: TSH: 1.33 u[IU]/mL (ref 0.35–5.50)

## 2020-08-22 LAB — HEMOGLOBIN A1C: Hgb A1c MFr Bld: 5.9 % (ref 4.6–6.5)

## 2020-08-22 MED ORDER — METHYLPHENIDATE HCL 5 MG PO TABS: 5.0000 mg | ORAL_TABLET | Freq: Two times a day (BID) | ORAL | 0 refills | Status: DC

## 2020-08-22 NOTE — Assessment & Plan Note (Signed)
hgba1c acceptable, minimize simple carbs. Increase exercise as tolerated.  

## 2020-08-22 NOTE — Assessment & Plan Note (Signed)
Patient encouraged to maintain heart healthy diet, regular exercise, adequate sleep. Consider daily probiotics. Take medications as prescribed. Labs reviewed and ordered. Last colonoscopy 2015. Will request pathology results from GI

## 2020-08-22 NOTE — Assessment & Plan Note (Signed)
Improving after using orthotics. Consider topical and icing as needed.

## 2020-08-22 NOTE — Assessment & Plan Note (Signed)
Encourage heart healthy diet such as MIND or DASH diet, increase exercise, avoid trans fats, simple carbohydrates and processed foods, consider a krill or fish or flaxseed oil cap daily.  °

## 2020-08-22 NOTE — Patient Instructions (Signed)
Preventive Care 67 Years and Older, Male Preventive care refers to lifestyle choices and visits with your health care provider that can promote health and wellness. This includes: A yearly physical exam. This is also called an annual wellness visit. Regular dental and eye exams. Immunizations. Screening for certain conditions. Healthy lifestyle choices, such as: Eating a healthy diet. Getting regular exercise. Not using drugs or products that contain nicotine and tobacco. Limiting alcohol use. What can I expect for my preventive care visit? Physical exam Your health care provider will check your: Height and weight. These may be used to calculate your BMI (body mass index). BMI is a measurement that tells if you are at a healthy weight. Heart rate and blood pressure. Body temperature. Skin for abnormal spots. Counseling Your health care provider may ask you questions about your: Past medical problems. Family's medical history. Alcohol, tobacco, and drug use. Emotional well-being. Home life and relationship well-being. Sexual activity. Diet, exercise, and sleep habits. History of falls. Memory and ability to understand (cognition). Work and work environment. Access to firearms. What immunizations do I need?  Vaccines are usually given at various ages, according to a schedule. Your health care provider will recommend vaccines for you based on your age, medicalhistory, and lifestyle or other factors, such as travel or where you work. What tests do I need? Blood tests Lipid and cholesterol levels. These may be checked every 5 years, or more often depending on your overall health. Hepatitis C test. Hepatitis B test. Screening Lung cancer screening. You may have this screening every year starting at age 55 if you have a 30-pack-year history of smoking and currently smoke or have quit within the past 15 years. Colorectal cancer screening. All adults should have this screening  starting at age 50 and continuing until age 75. Your health care provider may recommend screening at age 45 if you are at increased risk. You will have tests every 1-10 years, depending on your results and the type of screening test. Prostate cancer screening. Recommendations will vary depending on your family history and other risks. Genital exam to check for testicular cancer or hernias. Diabetes screening. This is done by checking your blood sugar (glucose) after you have not eaten for a while (fasting). You may have this done every 1-3 years. Abdominal aortic aneurysm (AAA) screening. You may need this if you are a current or former smoker. STD (sexually transmitted disease) testing, if you are at risk. Follow these instructions at home: Eating and drinking  Eat a diet that includes fresh fruits and vegetables, whole grains, lean protein, and low-fat dairy products. Limit your intake of foods with high amounts of sugar, saturated fats, and salt. Take vitamin and mineral supplements as recommended by your health care provider. Do not drink alcohol if your health care provider tells you not to drink. If you drink alcohol: Limit how much you have to 0-2 drinks a day. Be aware of how much alcohol is in your drink. In the [place] one drink equals one 12 oz bottle of beer (355 mL), one 5 oz glass of wine (148 mL), or one 1 oz glass of hard liquor (44 mL).  Lifestyle Take daily care of your teeth and gums. Brush your teeth every morning and night with fluoride toothpaste. Floss one time each day. Stay active. Exercise for at least 30 minutes 5 or more days each week. Do not use any products that contain nicotine or tobacco, such as cigarettes, e-cigarettes, and chewing tobacco.   If you need help quitting, ask your health care provider. Do not use drugs. If you are sexually active, practice safe sex. Use a condom or other form of protection to prevent STIs (sexually transmitted infections). Talk  with your health care provider about taking a low-dose aspirin or statin. Find healthy ways to cope with stress, such as: Meditation, yoga, or listening to music. Journaling. Talking to a trusted person. Spending time with friends and family. Safety Always wear your seat belt while driving or riding in a vehicle. Do not drive: If you have been drinking alcohol. Do not ride with someone who has been drinking. When you are tired or distracted. While texting. Wear a helmet and other protective equipment during sports activities. If you have firearms in your house, make sure you follow all gun safety procedures. What's next? Visit your health care provider once a year for an annual wellness visit. Ask your health care provider how often you should have your eyes and teeth checked. Stay up to date on all vaccines. This information is not intended to replace advice given to you by your health care provider. Make sure you discuss any questions you have with your healthcare provider. Document Revised: 09/26/2018 Document Reviewed: 12/22/2017 Elsevier Patient Education  2022 Elsevier Inc.  

## 2020-08-22 NOTE — Assessment & Plan Note (Signed)
Stable on current meds 

## 2020-08-22 NOTE — Assessment & Plan Note (Signed)
Requesting pathology

## 2020-08-22 NOTE — Assessment & Plan Note (Signed)
Persistent stiffness and discomfort in thumb but slowly improving

## 2020-08-22 NOTE — Progress Notes (Signed)
Subjective:    Patient ID: Austin Macdonald, male    DOB: September 22, 1955, 65 y.o.   MRN: [id number]  Chief Complaint  Patient presents with   Annual Exam    HPI Patient is in today for annual preventative exam and follow up on chronic medical concerns. No recent febrile illness or hospitalizations. No acute concerns. He continues to work full time, travel for work and pleasure. No worsening concerns regarding his memory. He stays active and maintains a heart healthy diet. He notes his left heel pain and right thumb pain are both some improved and no new Musculoskeletal complaints noted. Denies CP/palp/SOB/HA/congestion/fevers/GI or GU c/o. Taking meds as prescribed   Past Medical History:  Diagnosis Date   ADD (attention deficit disorder) 12/30/2015   Benign paroxysmal positional vertigo 12/26/2014   Chicken pox as a child   Colon polyps    Elevated BP 03/03/2015   Hay fever    seasonal, spring, fall   Hyperglycemia 03/03/2015   Measles as a child   Mumps as a child   Preventative health care 07/25/2012   Tinnitus 12/29/2014    Past Surgical History:  Procedure Laterality Date   knee arthroscopic repair  2004   right    Family History  Problem Relation Age of Onset   Hypertension Mother    Heart disease Mother        CHF   Other Paternal Grandfather        black lung   Thyroid cancer Daughter    Dementia Father     Social History   Socioeconomic History   Marital status: Married    Spouse name: Not on file   Number of children: Not on file   Years of education: Not on file   Highest education level: Not on file  Occupational History   Not on file  Tobacco Use   Smoking status: Never   Smokeless tobacco: Never  Substance and Sexual Activity   Alcohol use: Yes    Alcohol/week: 0.0 standard drinks    Comment: 10-12 drinks weekly   Drug use: No   Sexual activity: Yes    Comment: lives with wife, travels for work Scientist, water quality, no major dietary restrictions  Other  Topics Concern   Not on file  Social History Narrative   Not on file   Social Determinants of Health   Financial Resource Strain: Not on file  Food Insecurity: Not on file  Transportation Needs: Not on file  Physical Activity: Not on file  Stress: Not on file  Social Connections: Not on file  Intimate Partner Violence: Not on file    Outpatient Medications Prior to Visit  Medication Sig Dispense Refill   ibuprofen (ADVIL) 200 MG tablet Take 1 tablet (200 mg total) by mouth every 6 (six) hours as needed for pain. 30 tablet 0   loratadine (CLARITIN) 10 MG tablet Take 10 mg by mouth daily.     methylphenidate (RITALIN) 5 MG tablet Take 1 tablet (5 mg total) by mouth 2 (two) times daily with breakfast and lunch. June 2022 60 tablet 0   methylphenidate (RITALIN) 5 MG tablet Take 1 tablet (5 mg total) by mouth 2 (two) times daily with breakfast and lunch. May 2022 60 tablet 0   methylphenidate (RITALIN) 5 MG tablet Take 1 tablet (5 mg total) by mouth 2 (two) times daily with breakfast and lunch. July 2022 60 tablet 0   meloxicam (MOBIC) 15 MG tablet Take 1 tablet (15 mg  total) by mouth daily. 30 tablet 0   No facility-administered medications prior to visit.    No Known Allergies  Review of Systems  Constitutional:  Negative for chills, fever and malaise/fatigue.  HENT:  Negative for congestion and hearing loss.   Eyes:  Negative for double vision and discharge.  Respiratory:  Negative for cough, sputum production and shortness of breath.   Cardiovascular:  Negative for chest pain, palpitations and leg swelling.  Gastrointestinal:  Negative for abdominal pain, blood in stool, constipation, diarrhea, heartburn, nausea and vomiting.  Genitourinary:  Negative for dysuria, frequency, hematuria and urgency.  Musculoskeletal:  Negative for back pain, falls and myalgias.  Skin:  Negative for rash.  Neurological:  Negative for dizziness, sensory change, loss of consciousness, weakness and  headaches.  Endo/Heme/Allergies:  Negative for environmental allergies. Does not bruise/bleed easily.  Psychiatric/Behavioral:  Positive for memory loss. Negative for depression and suicidal ideas. The patient is not nervous/anxious and does not have insomnia.       Objective:    Physical Exam Constitutional:      General: He is not in acute distress.    Appearance: Normal appearance. He is not ill-appearing or toxic-appearing.  HENT:     Head: Normocephalic and atraumatic.     Right Ear: Tympanic membrane, ear canal and external ear normal.     Left Ear: Tympanic membrane, ear canal and external ear normal.     Nose: Nose normal.  Eyes:     General:        Right eye: No discharge.        Left eye: No discharge.  Cardiovascular:     Rate and Rhythm: Normal rate and regular rhythm.     Pulses: Normal pulses.  Pulmonary:     Effort: Pulmonary effort is normal.     Breath sounds: Normal breath sounds.  Abdominal:     General: Bowel sounds are normal.  Musculoskeletal:        General: Normal range of motion.     Cervical back: Normal range of motion and neck supple.     Right lower leg: No edema.     Left lower leg: No edema.  Lymphadenopathy:     Cervical: No cervical adenopathy.  Skin:    Findings: No rash.  Neurological:     Mental Status: He is alert and oriented to person, place, and time.     Cranial Nerves: No cranial nerve deficit.     Sensory: No sensory deficit.  Psychiatric:        Mood and Affect: Mood normal.        Behavior: Behavior normal.    BP 122/84   Pulse 61   Temp 98.1 F (36.7 C)   Resp 16   Ht '6\' 1"'$  (1.854 m)   Wt 213 lb 3.2 oz (96.7 kg)   SpO2 98%   BMI 28.13 kg/m  Wt Readings from Last 3 Encounters:  08/22/20 213 lb 3.2 oz (96.7 kg)  09/03/19 209 lb (94.8 kg)  07/11/19 209 lb 4 oz (94.9 kg)    Diabetic Foot Exam - Simple   No data filed    Lab Results  Component Value Date   WBC 4.6 08/22/2020   HGB 13.9 08/22/2020   HCT 41.7  08/22/2020   PLT 251.0 08/22/2020   GLUCOSE 95 08/22/2020   CHOL 188 08/22/2020   TRIG 110.0 08/22/2020   HDL 57.00 08/22/2020   LDLDIRECT 112.0 08/28/2018   LDLCALC 109 (  H) 08/22/2020   ALT 8 08/22/2020   AST 15 08/22/2020   NA 140 08/22/2020   K 4.4 08/22/2020   CL 104 08/22/2020   CREATININE 0.74 08/22/2020   BUN 13 08/22/2020   CO2 28 08/22/2020   TSH 1.33 08/22/2020   PSA 0.9 09/03/2019   HGBA1C 5.9 08/22/2020    Lab Results  Component Value Date   TSH 1.33 08/22/2020   Lab Results  Component Value Date   WBC 4.6 08/22/2020   HGB 13.9 08/22/2020   HCT 41.7 08/22/2020   MCV 92.6 08/22/2020   PLT 251.0 08/22/2020   Lab Results  Component Value Date   NA 140 08/22/2020   K 4.4 08/22/2020   CO2 28 08/22/2020   GLUCOSE 95 08/22/2020   BUN 13 08/22/2020   CREATININE 0.74 08/22/2020   BILITOT 1.0 08/22/2020   ALKPHOS 48 08/22/2020   AST 15 08/22/2020   ALT 8 08/22/2020   PROT 6.3 08/22/2020   ALBUMIN 4.1 08/22/2020   CALCIUM 8.9 08/22/2020   GFR 94.20 08/22/2020   Lab Results  Component Value Date   CHOL 188 08/22/2020   Lab Results  Component Value Date   HDL 57.00 08/22/2020   Lab Results  Component Value Date   LDLCALC 109 (H) 08/22/2020   Lab Results  Component Value Date   TRIG 110.0 08/22/2020   Lab Results  Component Value Date   CHOLHDL 3 08/22/2020   Lab Results  Component Value Date   HGBA1C 5.9 08/22/2020       Assessment & Plan:   Problem List Items Addressed This Visit     Colon polyps    Requesting pathology      Preventative health care    Patient encouraged to maintain heart healthy diet, regular exercise, adequate sleep. Consider daily probiotics. Take medications as prescribed. Labs reviewed and ordered. Last colonoscopy 2015. Will request pathology results from GI      Relevant Orders   CBC with Differential/Platelet (Completed)   Comprehensive metabolic panel (Completed)   TSH (Completed)   Memory  difficulties    He continues to work full time and travel with his work. He does not endorse worsening.       Hyperglycemia    hgba1c acceptable, minimize simple carbs. Increase exercise as tolerated.       Relevant Orders   Hemoglobin A1c (Completed)   Comprehensive metabolic panel (Completed)   TSH (Completed)   ADD (attention deficit disorder)    Stable on current meds      Relevant Medications   methylphenidate (RITALIN) 5 MG tablet   methylphenidate (RITALIN) 5 MG tablet   methylphenidate (RITALIN) 5 MG tablet   Hyperlipidemia, mixed    Encourage heart healthy diet such as MIND or DASH diet, increase exercise, avoid trans fats, simple carbohydrates and processed foods, consider a krill or fish or flaxseed oil cap daily.       Relevant Orders   TSH (Completed)   Lipid panel (Completed)   Heel spur, left    Improving after using orthotics. Consider topical and icing as needed.      Pain of right thumb    Persistent stiffness and discomfort in thumb but slowly improving       I have discontinued Nehemiah Massed "Walt"'s meloxicam. I have also changed his methylphenidate, methylphenidate, and methylphenidate. Additionally, I am having him maintain his loratadine and ibuprofen.  Meds ordered this encounter  Medications   methylphenidate (RITALIN) 5 MG  tablet    Sig: Take 1 tablet (5 mg total) by mouth 2 (two) times daily with breakfast and lunch. october 2022    Dispense:  60 tablet    Refill:  0   methylphenidate (RITALIN) 5 MG tablet    Sig: Take 1 tablet (5 mg total) by mouth 2 (two) times daily with breakfast and lunch. September 2022    Dispense:  60 tablet    Refill:  0   methylphenidate (RITALIN) 5 MG tablet    Sig: Take 1 tablet (5 mg total) by mouth 2 (two) times daily with breakfast and lunch. August 2022    Dispense:  60 tablet    Refill:  0     Penni Homans, MD

## 2020-08-23 NOTE — Assessment & Plan Note (Signed)
He continues to work full time and travel with his work. He does not endorse worsening.

## 2020-09-08 ENCOUNTER — Telehealth: Payer: 59 | Admitting: Family Medicine

## 2020-09-20 IMAGING — DX DG FINGER RING 2+V*L*
3 series · 3 of 3 positions shown · non-contrast
Comparison: None.

CLINICAL DATA: Jammed finger yesterday with pain, initial encounter

EXAM:
LEFT RING FINGER 2+V

[finger ap]
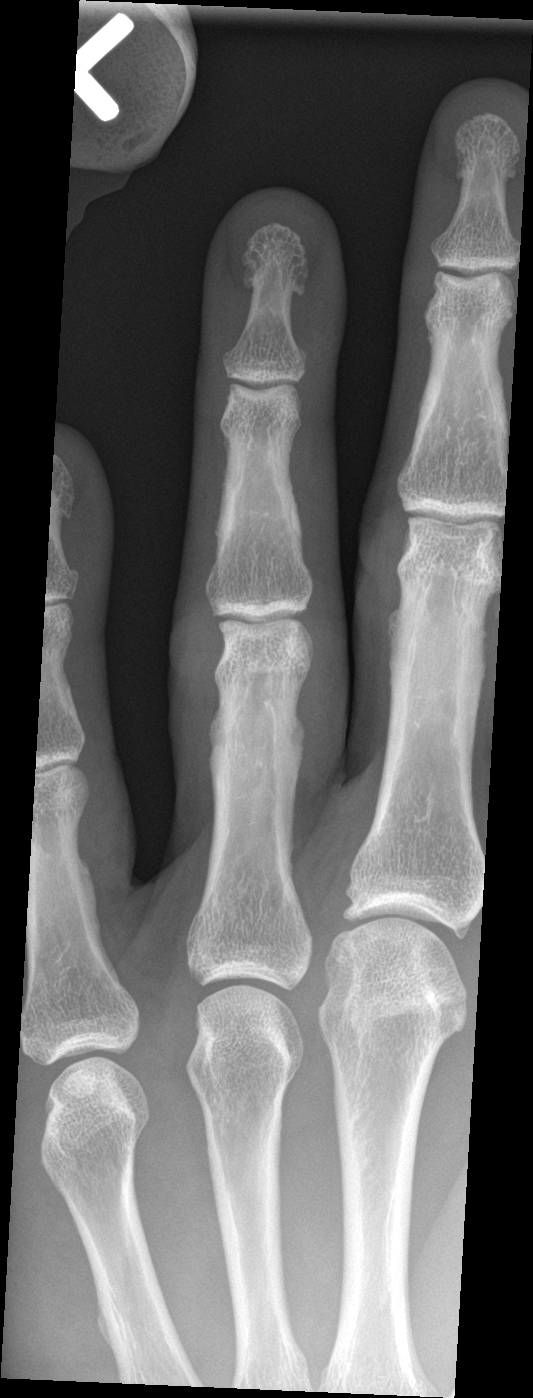

[finger obl]
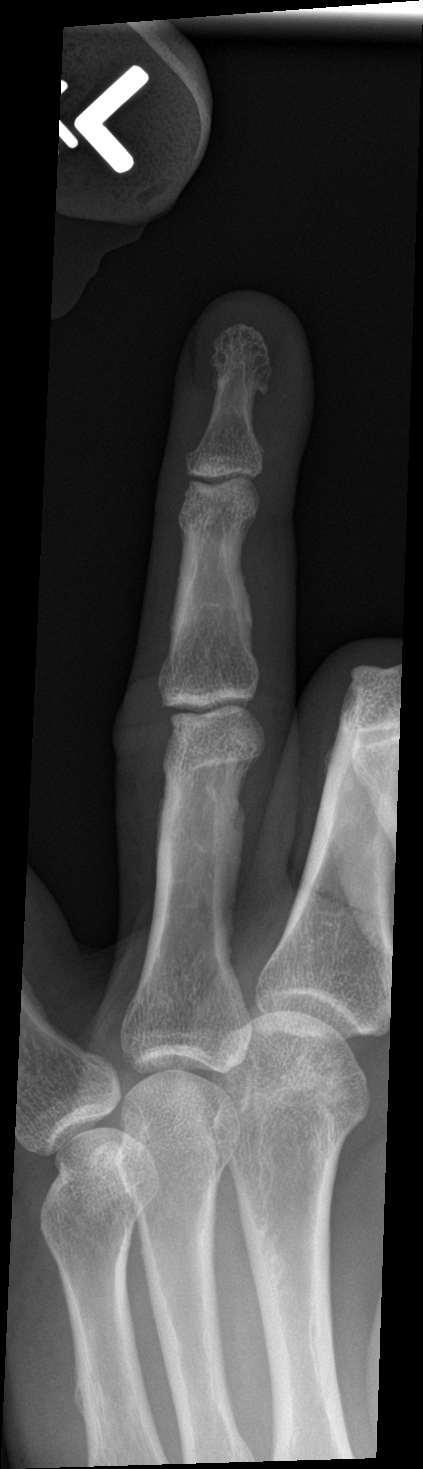

[finger lat]
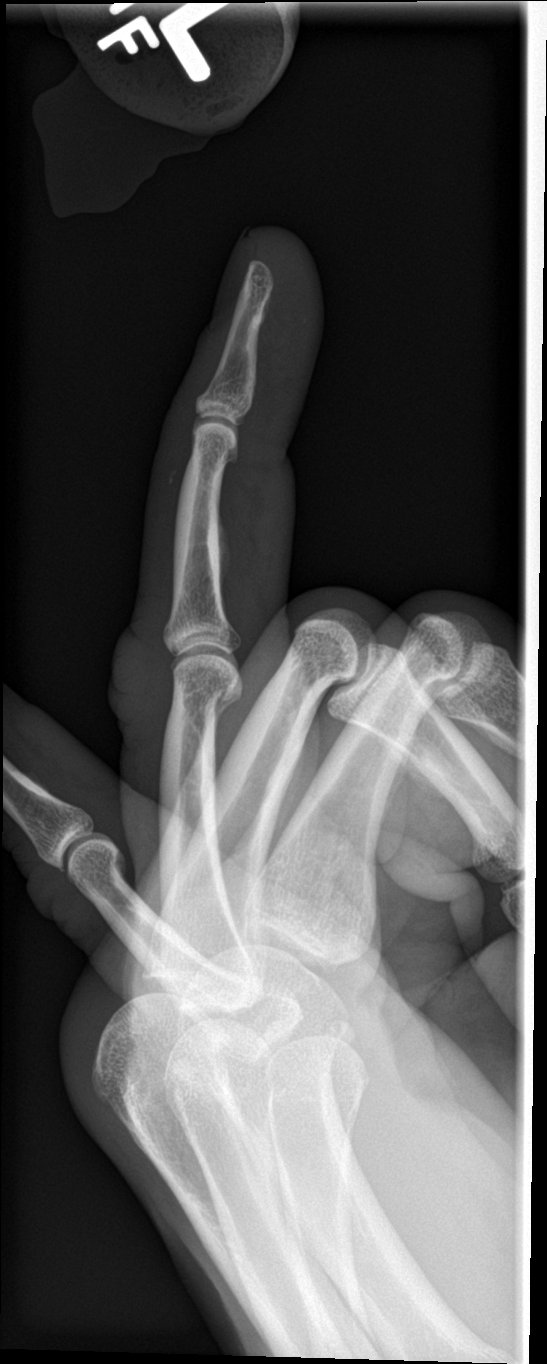

[3 of 3 positions shown; findings below may reference images not displayed]

FINDINGS: There is no evidence of fracture or dislocation. There is no
evidence of arthropathy or other focal bone abnormality. Soft
tissues are unremarkable.
IMPRESSION: No acute abnormality noted.

## 2020-11-28 ENCOUNTER — Other Ambulatory Visit: Payer: Self-pay | Admitting: Family Medicine

## 2020-11-28 ENCOUNTER — Telehealth: Payer: Self-pay | Admitting: Family Medicine

## 2020-11-28 DIAGNOSIS — F988 Other specified behavioral and emotional disorders with onset usually occurring in childhood and adolescence: Secondary | ICD-10-CM

## 2020-11-28 MED ORDER — METHYLPHENIDATE HCL 5 MG PO TABS: 5.0000 mg | ORAL_TABLET | Freq: Two times a day (BID) | ORAL | 0 refills | Status: DC

## 2020-11-28 NOTE — Telephone Encounter (Signed)
Medication: methylphenidate (RITALIN) 5 MG tablet   Has the patient contacted their pharmacy? Yes.   (If no, request that the patient contact the pharmacy for the refill.) (If yes, when and what did the pharmacy advise?)  Preferred Pharmacy (with phone number or street name):  [id number] [place], [address], [place] [address]  Phone:  [phone]  Fax:  [phone]  Agent: Please be advised that RX refills may take up to 3 business days. We ask that you follow-up with your pharmacy.

## 2020-11-28 NOTE — Telephone Encounter (Signed)
Patient notified that rxs was sent in.

## 2021-01-26 ENCOUNTER — Telehealth: Payer: Self-pay

## 2021-01-26 NOTE — Telephone Encounter (Signed)
Lvm to call back

## 2021-01-26 NOTE — Telephone Encounter (Signed)
Pt called stating he got notification from [place] that the authorization will be expiring on his Ritalin 5mg  60 days from 01/14/21.

## 2021-01-27 NOTE — Telephone Encounter (Signed)
Pt called back regarding medication. He stated if he is unavailable it is okay to leave a message on phone, or in [place]. Please advise.

## 2021-01-28 NOTE — Telephone Encounter (Signed)
Pt will schedule on mychart

## 2021-02-27 ENCOUNTER — Other Ambulatory Visit: Payer: Self-pay | Admitting: Family Medicine

## 2021-02-27 DIAGNOSIS — F988 Other specified behavioral and emotional disorders with onset usually occurring in childhood and adolescence: Secondary | ICD-10-CM

## 2021-02-27 MED ORDER — METHYLPHENIDATE HCL 5 MG PO TABS: 5.0000 mg | ORAL_TABLET | Freq: Two times a day (BID) | ORAL | 0 refills | Status: DC

## 2021-02-27 NOTE — Telephone Encounter (Signed)
Medication sent in. 

## 2021-02-27 NOTE — Telephone Encounter (Signed)
Pt stated he called in January to have this filled, and it never got filled.  Medication: methylphenidate (RITALIN) 5 MG tablet   Has the patient contacted their pharmacy? No.   [id number] [place], [place], [place] [address]  Phone:  [phone]  Fax:  [phone]

## 2021-02-27 NOTE — Telephone Encounter (Signed)
Requesting: ritalin 5 mg  Contract:08/28/18 UDS:09/03/19 Last Visit:08/22/20 Next Visit:05/28/21 Last Refill:11/28/20  Please Advise

## 2021-03-31 ENCOUNTER — Telehealth: Payer: Self-pay | Admitting: Family Medicine

## 2021-03-31 NOTE — Telephone Encounter (Signed)
Medication:  ?methylphenidate (RITALIN) 5 MG tablet ?Has the patient contacted their pharmacy? No. ? ?Preferred Pharmacy (with phone number or street name):  ?[id number] [place], [place] - [address]  ?[id number], [place] [address] ?Phone:  [phone] Fax:  [phone] ? ?Agent: Please be advised that RX refills may take up to 3 business days. We ask that you follow-up with your pharmacy.  ?

## 2021-04-01 ENCOUNTER — Other Ambulatory Visit: Payer: Self-pay

## 2021-04-01 ENCOUNTER — Other Ambulatory Visit: Payer: Self-pay | Admitting: Family Medicine

## 2021-04-01 DIAGNOSIS — F988 Other specified behavioral and emotional disorders with onset usually occurring in childhood and adolescence: Secondary | ICD-10-CM

## 2021-04-01 MED ORDER — METHYLPHENIDATE HCL 5 MG PO TABS: 5.0000 mg | ORAL_TABLET | Freq: Two times a day (BID) | ORAL | 0 refills | Status: DC

## 2021-04-01 MED ORDER — METHYLPHENIDATE HCL 5 MG PO TABS
5.0000 mg | ORAL_TABLET | Freq: Two times a day (BID) | ORAL | 0 refills | Status: DC
Start: 2021-04-01 — End: 2021-05-28

## 2021-04-01 NOTE — Progress Notes (Unsigned)
Requesting: ritalin '5MG'$  ?Contract: 08/28/18 ?UDS:09/03/19 ?Last Visit: 08/22/20 ?Next Visit: 05/28/21 ?Last Refill: 02/27/21 ? ?Please Advise  ?

## 2021-04-01 NOTE — Telephone Encounter (Signed)
Request sent to PCP

## 2021-05-06 ENCOUNTER — Telehealth: Payer: Self-pay | Admitting: Family Medicine

## 2021-05-06 NOTE — Telephone Encounter (Signed)
Patient needs medication refilled  ? ?Methylphenidate '5MG'$  - [facility] [place]  ?

## 2021-05-06 NOTE — Telephone Encounter (Signed)
Dr Charlett Blake sent May prescription in advance. Notified pt.  ?

## 2021-05-28 ENCOUNTER — Ambulatory Visit: Payer: 59 | Admitting: Family Medicine

## 2021-05-28 ENCOUNTER — Encounter: Payer: Self-pay | Admitting: Family Medicine

## 2021-05-28 VITALS — BP 140/86 | HR 64 | Ht 73.0 in | Wt 214.4 lb

## 2021-05-28 DIAGNOSIS — Z79899 Other long term (current) drug therapy: Secondary | ICD-10-CM

## 2021-05-28 DIAGNOSIS — F988 Other specified behavioral and emotional disorders with onset usually occurring in childhood and adolescence: Secondary | ICD-10-CM | POA: Diagnosis not present

## 2021-05-28 MED ORDER — METHYLPHENIDATE HCL 5 MG PO TABS: 5.0000 mg | ORAL_TABLET | Freq: Two times a day (BID) | ORAL | 0 refills | Status: DC

## 2021-05-28 NOTE — Progress Notes (Signed)
   Established Patient Office Visit  Subjective   Patient ID: Austin Macdonald, male    DOB: 1955-12-25  Age: 66 y.o. MRN: [id number]  CC: ADD f/u, refills    HPI   ADD F/U: - Ritalin 5 mg BID - Patient reports he is getting good response from this dose still and no adverse effects. Patient denies any chest pain, palpitations, dyspnea, wheezing, edema, recurrent headaches, vision changes.   No other complaints or concerns this visit.       ROS All review of systems negative except what is listed in the HPI    Objective:     BP 140/86   Pulse 64   Ht '6\' 1"'$  (1.854 m)   Wt 214 lb 6.4 oz (97.3 kg)   BMI 28.29 kg/m    Physical Exam Vitals reviewed.  Constitutional:      Appearance: Normal appearance. He is normal weight.  Cardiovascular:     Rate and Rhythm: Normal rate and regular rhythm.     Pulses: Normal pulses.     Heart sounds: Normal heart sounds.  Pulmonary:     Effort: Pulmonary effort is normal.     Breath sounds: Normal breath sounds.  Musculoskeletal:     Right lower leg: No edema.     Left lower leg: No edema.  Skin:    General: Skin is warm and dry.  Neurological:     General: No focal deficit present.     Mental Status: He is alert and oriented to person, place, and time. Mental status is at baseline.  Psychiatric:        Mood and Affect: Mood normal.        Behavior: Behavior normal.        Thought Content: Thought content normal.        Judgment: Judgment normal.    No results found for any visits on 05/28/21.     The 10-year ASCVD risk score (Arnett DK, et al., 2019) is: 15.3%    Assessment & Plan:   1. Attention deficit disorder (ADD) without hyperactivity 2. High risk medication use Stable. Doing well on current dose. Refills ordered for June, July, August. PDMP reviewed. Patient aware of signs/symptoms requiring further/urgent evaluation.   - methylphenidate (RITALIN) 5 MG tablet; Take 1 tablet (5 mg total) by mouth 2 (two)  times daily with breakfast and lunch. August 2023  Dispense: 60 tablet; Refill: 0 - methylphenidate (RITALIN) 5 MG tablet; Take 1 tablet (5 mg total) by mouth 2 (two) times daily with breakfast and lunch. July 2023  Dispense: 60 tablet; Refill: 0 - methylphenidate (RITALIN) 5 MG tablet; Take 1 tablet (5 mg total) by mouth 2 (two) times daily with breakfast and lunch. June 2023  Dispense: 60 tablet; Refill: 0 - DRUG MONITORING, PANEL 8 WITH CONFIRMATION, URINE    Return in about 3 months (around 08/28/2021) for routine/CPE.    Terrilyn Saver, NP

## 2021-05-30 LAB — DRUG MONITORING, PANEL 8 WITH CONFIRMATION, URINE
6 Acetylmorphine: NEGATIVE ng/mL (ref <10)
Alcohol Metabolites: POSITIVE ng/mL — AB (ref <500)
Amphetamines: NEGATIVE ng/mL (ref <500)
Benzodiazepines: NEGATIVE ng/mL (ref <100)
Buprenorphine, Urine: NEGATIVE ng/mL (ref <5)
Cocaine Metabolite: NEGATIVE ng/mL (ref <150)
Creatinine: 51.2 mg/dL (ref >=20.0)
Ethyl Glucuronide (ETG): 549 ng/mL — ABNORMAL HIGH (ref <500)
Ethyl Sulfate (ETS): 117 ng/mL — ABNORMAL HIGH (ref <100)
MDMA: NEGATIVE ng/mL (ref <500)
Marijuana Metabolite: NEGATIVE ng/mL (ref <20)
Opiates: NEGATIVE ng/mL (ref <100)
Oxidant: NEGATIVE ug/mL (ref <200)
Oxycodone: NEGATIVE ng/mL (ref <100)
pH: 5.7 (ref 4.5–9.0)

## 2021-05-30 LAB — DM TEMPLATE

## 2021-10-30 ENCOUNTER — Telehealth: Payer: Self-pay | Admitting: Family Medicine

## 2021-10-30 NOTE — Telephone Encounter (Signed)
Medication: methylphenidate (RITALIN) 5 MG tablet  Has the patient contacted their pharmacy? No.   [facility]erred [facility]  [id number] [place] [address]Phone: [phone] Fax: [phone]

## 2021-11-02 ENCOUNTER — Other Ambulatory Visit: Payer: Self-pay | Admitting: Family Medicine

## 2021-11-02 DIAGNOSIS — F988 Other specified behavioral and emotional disorders with onset usually occurring in childhood and adolescence: Secondary | ICD-10-CM

## 2021-11-02 MED ORDER — METHYLPHENIDATE HCL 5 MG PO TABS: 5.0000 mg | ORAL_TABLET | Freq: Two times a day (BID) | ORAL | 0 refills | Status: DC

## 2021-11-02 NOTE — Telephone Encounter (Signed)
Called pt made appt for 11/24/21

## 2021-11-23 NOTE — Assessment & Plan Note (Signed)
hgba1c acceptable, minimize simple carbs. Increase exercise as tolerated.  

## 2021-11-23 NOTE — Assessment & Plan Note (Signed)
Doing well on low dose Ritalin, may continue the same

## 2021-11-23 NOTE — Assessment & Plan Note (Signed)
Maintain heart healthy diet such as PURE, MIND or DASH diet, increase exercise, avoid trans fats, simple carbohydrates and processed foods, consider fish or flaxseed oil cap daily.

## 2021-11-24 ENCOUNTER — Ambulatory Visit: Payer: 59 | Admitting: Family Medicine

## 2021-11-24 VITALS — BP 136/82 | HR 62 | Temp 97.8°F | Resp 16 | Ht 74.0 in | Wt 214.8 lb

## 2021-11-24 DIAGNOSIS — E782 Mixed hyperlipidemia: Secondary | ICD-10-CM | POA: Diagnosis not present

## 2021-11-24 DIAGNOSIS — R739 Hyperglycemia, unspecified: Secondary | ICD-10-CM

## 2021-11-24 DIAGNOSIS — M25561 Pain in right knee: Secondary | ICD-10-CM | POA: Diagnosis not present

## 2021-11-24 DIAGNOSIS — F988 Other specified behavioral and emotional disorders with onset usually occurring in childhood and adolescence: Secondary | ICD-10-CM | POA: Diagnosis not present

## 2021-11-24 LAB — LIPID PANEL
Cholesterol: 197 (ref 0–200)
HDL: 48 (ref 35–70)
LDL Cholesterol: 123
LDl/HDL Ratio: 123
Triglycerides: 144 (ref 40–160)

## 2021-11-24 LAB — BASIC METABOLIC PANEL
Chloride: 101 (ref 99–108)
Glucose: 88
Potassium: 4.6 mEq/L (ref 3.5–5.1)
Sodium: 140 (ref 137–147)

## 2021-11-24 LAB — HEMOGLOBIN A1C: Hemoglobin A1C: 14.4

## 2021-11-24 LAB — HEPATIC FUNCTION PANEL
ALT: 18 U/L (ref 10–40)
AST: 29 (ref 14–40)
Alkaline Phosphatase: 74 (ref 25–125)
Bilirubin, Total: 0.6

## 2021-11-24 LAB — CBC AND DIFFERENTIAL
HCT: 42 (ref 41–53)
Hemoglobin: 14.4 (ref 13.5–17.5)
Platelets: 237 10*3/uL (ref 150–400)
WBC: 3.4

## 2021-11-24 LAB — COMPREHENSIVE METABOLIC PANEL
Albumin: 4.3 (ref 3.5–5.0)
Calcium: 8.7 (ref 8.7–10.7)
Globulin: 2.1

## 2021-11-24 LAB — CBC: RBC: 4.61 (ref 3.87–5.11)

## 2021-11-24 MED ORDER — METHYLPHENIDATE HCL 5 MG PO TABS: 5.0000 mg | ORAL_TABLET | Freq: Two times a day (BID) | ORAL | 0 refills | Status: DC

## 2021-11-24 NOTE — Progress Notes (Signed)
Subjective:   By signing my name below, I, Kellie Simmering, attest that this documentation has been prepared under the direction and in the presence of Mosie Lukes, MD., 11/24/2021.     Patient ID: Austin Macdonald, male    DOB: [birth year], 66 y.o.   MRN: [id number]  Chief Complaint  Patient presents with   Follow-up    Follow up    HPI Patient is in today for an office visit.  ADD: Patient is currently taking Methylphenidate 5 mg to manage his ADD and reports that it has been effective. He states that he occasionally takes only 1 tablet daily instead of 2.  Right Knee Pain: Patient reports that last year he tore his right meniscus while playing golf and is still experiencing pain. He further reports that he underwent surgery on the same knee 20 years ago. He states that the pain is not severe but it is constant. He is inquiring how he can manage this.   Immunization History  Administered Date(s) Administered   Fluad Quad(high Dose 65+) 11/08/2019, 10/27/2020, 10/28/2021   Influenza Split 10/12/2011, 09/19/2014   Influenza,inj,Quad PF,6+ Mos 10/10/2018   Influenza-Unspecified 09/26/2015   Moderna Sars-Covid-2 Vaccination 02/01/2019, 02/28/2019, 10/22/2019   PFIZER Comirnaty(Gray Top)Covid-19 Tri-Sucrose Vaccine 05/15/2020   Pfizer Covid-19 Vaccine Bivalent Booster 55yr & up 05/15/2021   Td 01/12/1999   Tdap 03/03/2015   Unspecified SARS-COV-2 Vaccination 09/30/2021   Zoster Recombinat (Shingrix) 12/26/2017, 03/16/2018   Zoster, Live 12/26/2014   Past Medical History:  Diagnosis Date   ADD (attention deficit disorder) 12/30/2015   Benign paroxysmal positional vertigo 12/26/2014   Chicken pox as a child   Colon polyps    Elevated BP 03/03/2015   Hay fever    seasonal, spring, fall   Hyperglycemia 03/03/2015   Measles as a child   Mumps as a child   Preventative health care 07/25/2012   Tinnitus 12/29/2014   Past Surgical History:  Procedure Laterality Date   knee  arthroscopic repair  2004   right   Family History  Problem Relation Age of Onset   Hypertension Mother    Heart disease Mother        CHF   Other Paternal Grandfather        black lung   Thyroid cancer Daughter    Dementia Father    Social History   Socioeconomic History   Marital status: Married    Spouse name: Not on file   Number of children: Not on file   Years of education: Not on file   Highest education level: Not on file  Occupational History   Not on file  Tobacco Use   Smoking status: Never   Smokeless tobacco: Never  Substance and Sexual Activity   Alcohol use: Yes    Alcohol/week: 0.0 standard drinks of alcohol    Comment: 10-12 drinks weekly   Drug use: No   Sexual activity: Yes    Comment: lives with wife, [place] no major dietary restrictions  Other Topics Concern   Not on file  Social History Narrative   Not on file   Social Determinants of Health   Financial Resource Strain: Not on file  Food Insecurity: Not on file  Transportation Needs: Not on file  Physical Activity: Not on file  Stress: Not on file  Social Connections: Not on file  Intimate Partner Violence: Not on file   Outpatient Medications Prior to Visit  Medication Sig Dispense Refill  ibuprofen (ADVIL) 200 MG tablet Take 1 tablet (200 mg total) by mouth every 6 (six) hours as needed for pain. 30 tablet 0   loratadine (CLARITIN) 10 MG tablet Take 10 mg by mouth daily.     methylphenidate (RITALIN) 5 MG tablet Take 1 tablet (5 mg total) by mouth 2 (two) times daily with breakfast and lunch. August 2023 60 tablet 0   methylphenidate (RITALIN) 5 MG tablet Take 1 tablet (5 mg total) by mouth 2 (two) times daily with breakfast and lunch. July 2023 60 tablet 0   methylphenidate (RITALIN) 5 MG tablet Take 1 tablet (5 mg total) by mouth 2 (two) times daily with breakfast and lunch. October 2023 60 tablet 0   No facility-administered medications prior to visit.   No  Known Allergies  Review of Systems  Musculoskeletal:        (+) right knee pain.      Objective:    Physical Exam Constitutional:      General: He is not in acute distress.    Appearance: Normal appearance. He is not ill-appearing.  HENT:     Head: Normocephalic and atraumatic.     Right Ear: External ear normal.     Left Ear: External ear normal.     Mouth/Throat:     Mouth: Mucous membranes are moist.     Pharynx: Oropharynx is clear.  Eyes:     Extraocular Movements: Extraocular movements intact.     Pupils: Pupils are equal, round, and reactive to light.  Cardiovascular:     Rate and Rhythm: Normal rate and regular rhythm.     Pulses: Normal pulses.     Heart sounds: Normal heart sounds. No murmur heard.    No gallop.  Pulmonary:     Effort: Pulmonary effort is normal. No respiratory distress.     Breath sounds: Normal breath sounds. No wheezing or rales.  Abdominal:     General: Bowel sounds are normal.  Skin:    General: Skin is warm and dry.  Neurological:     Mental Status: He is alert and oriented to person, place, and time.  Psychiatric:        Mood and Affect: Mood normal.        Behavior: Behavior normal.        Judgment: Judgment normal.    BP 136/82 (BP Location: Right Arm, Patient Position: Sitting, Cuff Size: Normal)   Pulse 62   Temp 97.8 F (36.6 C) (Oral)   Resp 16   Ht '6\' 2"'$  (1.88 m)   Wt 214 lb 12.8 oz (97.4 kg)   SpO2 97%   BMI 27.58 kg/m  Wt Readings from Last 3 Encounters:  11/24/21 214 lb 12.8 oz (97.4 kg)  05/28/21 214 lb 6.4 oz (97.3 kg)  08/22/20 213 lb 3.2 oz (96.7 kg)   Diabetic Foot Exam - Simple   No data filed    Lab Results  Component Value Date   WBC 4.6 08/22/2020   HGB 13.9 08/22/2020   HCT 41.7 08/22/2020   PLT 251.0 08/22/2020   GLUCOSE 95 08/22/2020   CHOL 188 08/22/2020   TRIG 110.0 08/22/2020   HDL 57.00 08/22/2020   LDLDIRECT 112.0 08/28/2018   LDLCALC 109 (H) 08/22/2020   ALT 8 08/22/2020   AST 15  08/22/2020   NA 140 08/22/2020   K 4.4 08/22/2020   CL 104 08/22/2020   CREATININE 0.74 08/22/2020   BUN 13 08/22/2020   CO2 28  08/22/2020   TSH 1.33 08/22/2020   PSA 0.9 09/03/2019   HGBA1C 5.9 08/22/2020   Lab Results  Component Value Date   TSH 1.33 08/22/2020   Lab Results  Component Value Date   WBC 4.6 08/22/2020   HGB 13.9 08/22/2020   HCT 41.7 08/22/2020   MCV 92.6 08/22/2020   PLT 251.0 08/22/2020   Lab Results  Component Value Date   NA 140 08/22/2020   K 4.4 08/22/2020   CO2 28 08/22/2020   GLUCOSE 95 08/22/2020   BUN 13 08/22/2020   CREATININE 0.74 08/22/2020   BILITOT 1.0 08/22/2020   ALKPHOS 48 08/22/2020   AST 15 08/22/2020   ALT 8 08/22/2020   PROT 6.3 08/22/2020   ALBUMIN 4.1 08/22/2020   CALCIUM 8.9 08/22/2020   GFR 94.20 08/22/2020   Lab Results  Component Value Date   CHOL 188 08/22/2020   Lab Results  Component Value Date   HDL 57.00 08/22/2020   Lab Results  Component Value Date   LDLCALC 109 (H) 08/22/2020   Lab Results  Component Value Date   TRIG 110.0 08/22/2020   Lab Results  Component Value Date   CHOLHDL 3 08/22/2020   Lab Results  Component Value Date   HGBA1C 5.9 08/22/2020      Assessment & Plan:   Problem List Items Addressed This Visit     Hyperglycemia    hgba1c acceptable, labs done thru wellness plan at work. minimize simple carbs. Increase exercise as tolerated.      ADD (attention deficit disorder) - Primary    Doing well on low dose Ritalin, may continue the same      Relevant Medications   methylphenidate (RITALIN) 5 MG tablet   methylphenidate (RITALIN) 5 MG tablet   methylphenidate (RITALIN) 5 MG tablet   Hyperlipidemia, mixed    Maintain heart healthy diet such as PURE, MIND or DASH diet, increase exercise, avoid trans fats, simple carbohydrates and processed foods, consider fish or flaxseed oil cap daily.        Right knee pain    Anteromedial aspect. Had arthroscopic surgery in this  knee 20 years ago and had been doing well til recently when he had a sudden pain while golfing. Pain comes and goes, no redness, swelling or warmth and does not limit his activities. If it worsens he will let us know so we can consider referring to sports medicine. For now try Lidocaine and Voltarne gel and ice to area once to twice daily      Meds ordered this encounter  Medications   methylphenidate (RITALIN) 5 MG tablet    Sig: Take 1 tablet (5 mg total) by mouth 2 (two) times daily with breakfast and lunch. November 2023    Dispense:  60 tablet    Refill:  0   methylphenidate (RITALIN) 5 MG tablet    Sig: Take 1 tablet (5 mg total) by mouth 2 (two) times daily with breakfast and lunch. December 2023    Dispense:  60 tablet    Refill:  0   methylphenidate (RITALIN) 5 MG tablet    Sig: Take 1 tablet (5 mg total) by mouth 2 (two) times daily with breakfast and lunch. January 2024    Dispense:  60 tablet    Refill:  0   I, Penni Homans, MD, personally preformed the services described in this documentation.  All medical record entries made by the scribe were at my direction and in my presence.  I have reviewed the chart and discharge instructions (if applicable) and agree that the record reflects my personal performance and is accurate and complete. 11/24/2021  I,Mohammed Iqbal,acting as a scribe for Penni Homans, MD.,have documented all relevant documentation on the behalf of Penni Homans, MD,as directed by  Penni Homans, MD while in the presence of Penni Homans, MD.  Penni Homans, MD

## 2021-11-24 NOTE — Assessment & Plan Note (Signed)
Anteromedial aspect. Had arthroscopic surgery in this knee 20 years ago and had been doing well til recently when he had a sudden pain while golfing. Pain comes and goes, no redness, swelling or warmth and does not limit his activities. If it worsens he will let us know so we can consider referring to sports medicine. For now try Lidocaine and Voltarne gel and ice to area once to twice daily

## 2021-11-24 NOTE — Patient Instructions (Addendum)
Arexvy, RSV, Respiratory Syncitial Virus at pharmacy  High Cholesterol  High cholesterol is a condition in which the blood has high levels of a white, waxy substance similar to fat (cholesterol). The liver makes all the cholesterol that the body needs. The human body needs small amounts of cholesterol to help build cells. A person gets extra or excess cholesterol from the food that he or she eats. The blood carries cholesterol from the liver to the rest of the body. If you have high cholesterol, deposits (plaques) may build up on the walls of your arteries. Arteries are the blood vessels that carry blood away from your heart. These plaques make the arteries narrow and stiff. Cholesterol plaques increase your risk for heart attack and stroke. Work with your health care provider to keep your cholesterol levels in a healthy range. What increases the risk? The following factors may make you more likely to develop this condition: Eating foods that are high in animal fat (saturated fat) or cholesterol. Being overweight. Not getting enough exercise. A family history of high cholesterol (familial hypercholesterolemia). Use of tobacco products. Having diabetes. What are the signs or symptoms? In most cases, high cholesterol does not usually cause any symptoms. In severe cases, very high cholesterol levels can cause: Fatty bumps under the skin (xanthomas). A white or gray ring around the black center (pupil) of the eye. How is this diagnosed? This condition may be diagnosed based on the results of a blood test. If you are older than 68 years of age, your health care provider may check your cholesterol levels every 4-6 years. You may be checked more often if you have high cholesterol or other risk factors for heart disease. The blood test for cholesterol measures: "Bad" cholesterol, or LDL cholesterol. This is the main type of cholesterol that causes heart disease. The desired level is less than 100  mg/dL (2.59 mmol/L). "Good" cholesterol, or HDL cholesterol. HDL helps protect against heart disease by cleaning the arteries and carrying the LDL to the liver for processing. The desired level for HDL is 60 mg/dL (1.55 mmol/L) or higher. Triglycerides. These are fats that your body can store or burn for energy. The desired level is less than 150 mg/dL (1.69 mmol/L). Total cholesterol. This measures the total amount of cholesterol in your blood and includes LDL, HDL, and triglycerides. The desired level is less than 200 mg/dL (5.17 mmol/L). How is this treated? Treatment for high cholesterol starts with lifestyle changes, such as diet and exercise. Diet changes. You may be asked to eat foods that have more fiber and less saturated fats or added sugar. Lifestyle changes. These may include regular exercise, maintaining a healthy weight, and quitting use of tobacco products. Medicines. These are given when diet and lifestyle changes have not worked. You may be prescribed a statin medicine to help lower your cholesterol levels. Follow these instructions at home: Eating and drinking  Eat a healthy, balanced diet. This diet includes: Daily servings of a variety of fresh, frozen, or canned fruits and vegetables. Daily servings of whole grain foods that are rich in fiber. Foods that are low in saturated fats and trans fats. These include poultry and fish without skin, lean cuts of meat, and low-fat dairy products. A variety of fish, especially oily fish that contain omega-3 fatty acids. Aim to eat fish at least 2 times a week. Avoid foods and drinks that have added sugar. Use healthy cooking methods, such as roasting, grilling, broiling, baking, poaching, steaming, and  stir-frying. Do not fry your food except for stir-frying. If you drink alcohol: Limit how much you have to: 0-1 drink a day for women who are not pregnant. 0-2 drinks a day for men. Know how much alcohol is in a drink. In the [place] one  drink equals one 12 oz bottle of beer (355 mL), one 5 oz glass of wine (148 mL), or one 1 oz glass of hard liquor (44 mL). Lifestyle  Get regular exercise. Aim to exercise for a total of 150 minutes a week. Increase your activity level by doing activities such as gardening, walking, and taking the stairs. Do not use any products that contain nicotine or tobacco. These products include cigarettes, chewing tobacco, and vaping devices, such as e-cigarettes. If you need help quitting, ask your health care provider. General instructions Take over-the-counter and prescription medicines only as told by your health care provider. Keep all follow-up visits. This is important. Where to find more information American Heart Association: www.heart.org National Heart, Lung, and Blood Institute: https://wilson-eaton.com/ Contact a health care provider if: You have trouble achieving or maintaining a healthy diet or weight. You are starting an exercise program. You are unable to stop smoking. Get help right away if: You have chest pain. You have trouble breathing. You have discomfort or pain in your jaw, neck, back, shoulder, or arm. You have any symptoms of a stroke. "BE FAST" is an easy way to remember the main warning signs of a stroke: B - Balance. Signs are dizziness, sudden trouble walking, or loss of balance. E - Eyes. Signs are trouble seeing or a sudden change in vision. F - Face. Signs are sudden weakness or numbness of the face, or the face or eyelid drooping on one side. A - Arms. Signs are weakness or numbness in an arm. This happens suddenly and usually on one side of the body. S - Speech. Signs are sudden trouble speaking, slurred speech, or trouble understanding what people say. T - Time. Time to call emergency services. Write down what time symptoms started. You have other signs of a stroke, such as: A sudden, severe headache with no known cause. Nausea or vomiting. Seizure. These symptoms may  represent a serious problem that is an emergency. Do not wait to see if the symptoms will go away. Get medical help right away. Call your local emergency services (911 in the [place].S[place] Do not drive yourself to the hospital. Summary Cholesterol plaques increase your risk for heart attack and stroke. Work with your health care provider to keep your cholesterol levels in a healthy range. Eat a healthy, balanced diet, get regular exercise, and maintain a healthy weight. Do not use any products that contain nicotine or tobacco. These products include cigarettes, chewing tobacco, and vaping devices, such as e-cigarettes. Get help right away if you have any symptoms of a stroke. This information is not intended to replace advice given to you by your health care provider. Make sure you discuss any questions you have with your health care provider. Document Revised: 07/31/2021 Document Reviewed: 03/03/2020 Elsevier Patient Education  [place].

## 2021-12-18 NOTE — Telephone Encounter (Signed)
Error

## 2021-12-23 ENCOUNTER — Telehealth: Payer: Self-pay | Admitting: *Deleted

## 2021-12-23 NOTE — Telephone Encounter (Signed)
Please advise the dispensing pharmacy to contact the [facility] at [phone] for assistance.  Spoke with pharmacy and patient last had rx filled on 12/08/21.  So it was too early.

## 2021-12-23 NOTE — Telephone Encounter (Signed)
Prior auth started via cover my meds.  Awaiting determination.  Key: [id number]

## 2022-01-07 ENCOUNTER — Telehealth: Payer: Self-pay | Admitting: Family Medicine

## 2022-01-07 NOTE — Telephone Encounter (Signed)
Pharmacy should already have refill on file.

## 2022-01-07 NOTE — Telephone Encounter (Signed)
Medication: methylphenidate (RITALIN) 5 MG tablet   Has the patient contacted their pharmacy? Yes.     [facility]    [id number] [place] [address]Phone: [phone] Fax: [phone]

## 2022-02-19 ENCOUNTER — Telehealth: Payer: Self-pay | Admitting: Family Medicine

## 2022-02-19 ENCOUNTER — Other Ambulatory Visit: Payer: Self-pay | Admitting: Family Medicine

## 2022-02-19 DIAGNOSIS — F988 Other specified behavioral and emotional disorders with onset usually occurring in childhood and adolescence: Secondary | ICD-10-CM

## 2022-02-19 MED ORDER — METHYLPHENIDATE HCL 5 MG PO TABS: 5.0000 mg | ORAL_TABLET | Freq: Two times a day (BID) | ORAL | 0 refills | Status: DC

## 2022-02-19 NOTE — Telephone Encounter (Signed)
Prescription Request  02/19/2022  Is this a "Controlled Substance" medicine? No  LOV: 11/24/2021  What is the name of the medication or equipment?   methylphenidate (RITALIN) 5 MG tablet [id number]  (Pt is requesting the next three scripts for medication be sent to the pharmacy  Have you contacted your pharmacy to request a refill? No   Which pharmacy would you like this sent to?   [id number] [place] [place] - [address] [place] [address]Phone: [phone]: [phone] Patient notified that their request is being sent to the clinical staff for review and that they should receive a response within 2 business days.   Please advise at Mobile [phone](mobile)

## 2022-03-22 ENCOUNTER — Other Ambulatory Visit: Payer: Self-pay

## 2022-03-22 ENCOUNTER — Telehealth: Payer: Self-pay

## 2022-03-22 ENCOUNTER — Telehealth: Payer: Self-pay | Admitting: Family Medicine

## 2022-03-22 NOTE — Telephone Encounter (Signed)
Pt checking status on prior authorization of Methylphenidate. Please call to provide with status

## 2022-03-22 NOTE — Telephone Encounter (Signed)
PA has been started  Key: [employer]

## 2022-03-23 NOTE — Telephone Encounter (Signed)
Called pt was advised waiting on PA approval

## 2022-03-23 NOTE — Telephone Encounter (Signed)
PA approved. Effective 03/23/22 to 03/22/25.

## 2022-05-31 ENCOUNTER — Other Ambulatory Visit: Payer: Self-pay | Admitting: Family Medicine

## 2022-05-31 ENCOUNTER — Telehealth: Payer: Self-pay | Admitting: Family Medicine

## 2022-05-31 DIAGNOSIS — F988 Other specified behavioral and emotional disorders with onset usually occurring in childhood and adolescence: Secondary | ICD-10-CM

## 2022-05-31 MED ORDER — METHYLPHENIDATE HCL 5 MG PO TABS: 5.0000 mg | ORAL_TABLET | Freq: Two times a day (BID) | ORAL | 0 refills | Status: DC

## 2022-05-31 NOTE — Telephone Encounter (Signed)
Pt would like to know if the future scripts for this medication can be called in similar to the last time.  Prescription Request  05/31/2022  Is this a "Controlled Substance" medicine? No  LOV: Visit date not found  What is the name of the medication or equipment?   methylphenidate (RITALIN) 5 MG tablet [[id number]]   Have you contacted your pharmacy to request a refill? No   Which pharmacy would you like this sent to?   [id number] - [place], [place] - [address] [place] [address] Phone: [phone] Fax: [phone]  Patient notified that their request is being sent to the clinical staff for review and that they should receive a response within 2 business days.   Please advise at Mobile [phone] (mobile)

## 2022-06-01 ENCOUNTER — Encounter: Payer: 59 | Admitting: Family Medicine

## 2022-06-03 ENCOUNTER — Encounter: Payer: Self-pay | Admitting: Family Medicine

## 2022-06-03 ENCOUNTER — Ambulatory Visit (INDEPENDENT_AMBULATORY_CARE_PROVIDER_SITE_OTHER): Payer: 59 | Admitting: Family Medicine

## 2022-06-03 VITALS — BP 142/88 | HR 64 | Ht 74.0 in | Wt 220.0 lb

## 2022-06-03 DIAGNOSIS — Z0000 Encounter for general adult medical examination without abnormal findings: Secondary | ICD-10-CM | POA: Diagnosis not present

## 2022-06-03 DIAGNOSIS — I10 Essential (primary) hypertension: Secondary | ICD-10-CM | POA: Diagnosis not present

## 2022-06-03 DIAGNOSIS — Z131 Encounter for screening for diabetes mellitus: Secondary | ICD-10-CM

## 2022-06-03 LAB — LIPID PANEL
Cholesterol: 187 mg/dL (ref 0–200)
HDL: 54.4 mg/dL (ref >39.00)
LDL Cholesterol: 108 mg/dL — ABNORMAL HIGH (ref 0–99)
NonHDL: 132.96
Total CHOL/HDL Ratio: 3
Triglycerides: 125 mg/dL (ref 0.0–149.0)
VLDL: 25 mg/dL (ref 0.0–40.0)

## 2022-06-03 LAB — COMPREHENSIVE METABOLIC PANEL
ALT: 10 U/L (ref 0–53)
AST: 20 U/L (ref 0–37)
Albumin: 4 g/dL (ref 3.5–5.2)
Alkaline Phosphatase: 55 U/L (ref 39–117)
BUN: 14 mg/dL (ref 6–23)
CO2: 27 mEq/L (ref 19–32)
Calcium: 8.6 mg/dL (ref 8.4–10.5)
Chloride: 105 mEq/L (ref 96–112)
Creatinine, Ser: 0.78 mg/dL (ref 0.40–1.50)
GFR: 91.57 mL/min (ref >60.00)
Glucose, Bld: 95 mg/dL (ref 70–99)
Potassium: 4.2 mEq/L (ref 3.5–5.1)
Sodium: 139 mEq/L (ref 135–145)
Total Bilirubin: 0.7 mg/dL (ref 0.2–1.2)
Total Protein: 6.1 g/dL (ref 6.0–8.3)

## 2022-06-03 LAB — CBC WITH DIFFERENTIAL/PLATELET
Basophils Absolute: 0 10*3/uL (ref 0.0–0.1)
Basophils Relative: 0.8 % (ref 0.0–3.0)
Eosinophils Absolute: 0.2 10*3/uL (ref 0.0–0.7)
Eosinophils Relative: 4 % (ref 0.0–5.0)
HCT: 40.6 % (ref 39.0–52.0)
Hemoglobin: 13.5 g/dL (ref 13.0–17.0)
Lymphocytes Relative: 27.4 % (ref 12.0–46.0)
Lymphs Abs: 1.3 10*3/uL (ref 0.7–4.0)
MCHC: 33.2 g/dL (ref 30.0–36.0)
MCV: 92.5 fl (ref 78.0–100.0)
Monocytes Absolute: 0.5 10*3/uL (ref 0.1–1.0)
Monocytes Relative: 11.2 % (ref 3.0–12.0)
Neutro Abs: 2.7 10*3/uL (ref 1.4–7.7)
Neutrophils Relative %: 56.6 % (ref 43.0–77.0)
Platelets: 268 10*3/uL (ref 150.0–400.0)
RBC: 4.39 Mil/uL (ref 4.22–5.81)
RDW: 13 % (ref 11.5–15.5)
WBC: 4.8 10*3/uL (ref 4.0–10.5)

## 2022-06-03 LAB — HEMOGLOBIN A1C: Hgb A1c MFr Bld: 5.9 % (ref 4.6–6.5)

## 2022-06-03 NOTE — Assessment & Plan Note (Signed)
Blood pressure is not at goal for age and co-morbidities.   Recommendations: monitor at home daily or every other day and send Dr. Abner Greenspan a list of readings in a few weeks  - BP goal <130/80 - monitor and log blood pressures at home - check around the same time each day in a relaxed setting - Limit salt to <2000 mg/day - Follow DASH eating plan (heart healthy diet) - limit alcohol to 2 standard drinks per day for men and 1 per day for women - avoid tobacco products - get at least 2 hours of regular aerobic exercise weekly Patient aware of signs/symptoms requiring further/urgent evaluation. Labs updated today. *He is unable to come in for nurse recheck due to frequent travels this summer

## 2022-06-03 NOTE — Patient Instructions (Signed)
Blood pressure is not at goal for age and co-morbidities.   Recommendations: monitor at home daily or every other day and send Dr. Abner Greenspan a list of readings in a few weeks  - BP goal <130/80 - monitor and log blood pressures at home - check around the same time each day in a relaxed setting - Limit salt to <2000 mg/day - Follow DASH eating plan (heart healthy diet) - limit alcohol to 2 standard drinks per day for men and 1 per day for women - avoid tobacco products - get at least 2 hours of regular aerobic exercise weekly Patient aware of signs/symptoms requiring further/urgent evaluation. Labs updated today.

## 2022-06-03 NOTE — Progress Notes (Signed)
Complete physical exam  Patient: Austin Macdonald   DOB: [birth year]   67 y.o. Male  MRN: [id number]  Subjective:    Chief Complaint  Patient presents with   Annual Exam    Austin Macdonald is a 67 y.o. male who presents today for a complete physical exam. He reports consuming a general diet. Home exercise routine includes walking, swimming. He generally feels well. He reports sleeping well. He does not have additional problems to discuss today.   Currently lives with: wife  Acute concerns or interim problems since last visit: no  Vision concerns: no concerns  Dental concerns: no concerns    Patient endorses ETOH use. - 8-12 servings per week  Patient denies nicotine use. Patient denies illegal substance use.       Most recent fall risk assessment:    06/03/2022    8:46 AM  Fall Risk   Falls in the past year? 0  Number falls in past yr: 0  Injury with Fall? 0  Risk for fall due to : No Fall Risks  Follow up Falls evaluation completed     Most recent depression screenings:    06/03/2022    8:46 AM 11/24/2021   11:32 AM  PHQ 2/9 Scores  PHQ - 2 Score 0 0  PHQ- 9 Score 0             Patient Care Team: Bradd Canary, MD as PCP - General (Family Medicine) Gastroenterology, [facility] Sprang   Outpatient Medications Prior to Visit  Medication Sig   ibuprofen (ADVIL) 200 MG tablet Take 1 tablet (200 mg total) by mouth every 6 (six) hours as needed for pain.   loratadine (CLARITIN) 10 MG tablet Take 10 mg by mouth daily.   methylphenidate (RITALIN) 5 MG tablet Take 1 tablet (5 mg total) by mouth 2 (two) times daily with breakfast and lunch. March 2024   methylphenidate (RITALIN) 5 MG tablet Take 1 tablet (5 mg total) by mouth 2 (two) times daily with breakfast and lunch. April 2024   methylphenidate (RITALIN) 5 MG tablet Take 1 tablet (5 mg total) by mouth 2 (two) times daily with breakfast and lunch. May 2024   No facility-administered medications prior to visit.     ROS All review of systems negative except what is listed in the HPI        Objective:     BP (!) 142/88   Pulse 64   Ht 6\' 2"  (1.88 m)   Wt 220 lb (99.8 kg)   SpO2 98%   BMI 28.25 kg/m    Physical Exam Vitals reviewed.  Constitutional:      General: He is not in acute distress.    Appearance: Normal appearance. He is not ill-appearing.  HENT:     Head: Normocephalic and atraumatic.     Right Ear: Tympanic membrane normal.     Left Ear: Tympanic membrane normal.     Nose: Nose normal.     Mouth/Throat:     Mouth: Mucous membranes are moist.     Pharynx: Oropharynx is clear.  Eyes:     Extraocular Movements: Extraocular movements intact.     Conjunctiva/sclera: Conjunctivae normal.     Pupils: Pupils are equal, round, and reactive to light.  Neck:     Vascular: No carotid bruit.  Cardiovascular:     Rate and Rhythm: Normal rate and regular rhythm.     Pulses: Normal pulses.     Heart sounds:  Normal heart sounds.  Pulmonary:     Effort: Pulmonary effort is normal.     Breath sounds: Normal breath sounds.  Abdominal:     General: Abdomen is flat. Bowel sounds are normal. There is no distension.     Palpations: Abdomen is soft. There is no mass.     Tenderness: There is no abdominal tenderness. There is no right CVA tenderness, left CVA tenderness, guarding or rebound.  Genitourinary:    Comments: Deferred exam Musculoskeletal:        General: Normal range of motion.     Cervical back: Normal range of motion and neck supple. No tenderness.     Right lower leg: No edema.     Left lower leg: No edema.  Lymphadenopathy:     Cervical: No cervical adenopathy.  Skin:    General: Skin is warm and dry.     Capillary Refill: Capillary refill takes less than 2 seconds.  Neurological:     General: No focal deficit present.     Mental Status: He is alert and oriented to person, place, and time. Mental status is at baseline.  Psychiatric:        Mood and Affect:  Mood normal.        Behavior: Behavior normal.        Thought Content: Thought content normal.        Judgment: Judgment normal.      No results found for any visits on 06/03/22.     Assessment & Plan:    Routine Health Maintenance and Physical Exam Discussed health promotion and safety including diet and exercise recommendations, dental health, and injury prevention. Tobacco cessation if applicable. Seat belts, sunscreen, smoke detectors, etc.    Immunization History  Administered Date(s) Administered   COVID-19, mRNA, vaccine(Comirnaty)12 years and older 05/23/2022   Fluad Quad(high Dose 65+) 11/08/2019, 10/27/2020, 10/28/2021   Influenza Split 10/12/2011, 09/19/2014   Influenza,inj,Quad PF,6+ Mos 10/10/2018   Influenza-Unspecified 09/26/2015   Moderna Sars-Covid-2 Vaccination 02/01/2019, 02/28/2019, 10/22/2019   PFIZER Comirnaty(Gray Top)Covid-19 Tri-Sucrose Vaccine 05/15/2020   Pfizer Covid-19 Vaccine Bivalent Booster 40yrs & up 05/15/2021   Respiratory Syncytial Virus Vaccine,Recomb Aduvanted(Arexvy) 12/21/2021   Td 01/12/1999   Tdap 03/03/2015   Unspecified SARS-COV-2 Vaccination 09/30/2021   Zoster Recombinat (Shingrix) 12/26/2017, 03/16/2018   Zoster, Live 12/26/2014    Health Maintenance  Topic Date Due   Pneumonia Vaccine 39+ Years old (1 of 1 - PCV) 06/03/2023 (Originally 10/22/2018)   COVID-19 Vaccine (8 - 2023-24 season) 07/18/2022   INFLUENZA VACCINE  08/12/2022   COLONOSCOPY (Pts 45-26yrs Insurance coverage will need to be confirmed)  01/01/2024   DTaP/Tdap/Td (3 - Td or Tdap) 03/02/2025   Hepatitis C Screening  Completed   Zoster Vaccines- Shingrix  Completed   HPV VACCINES  Aged Out        Problem List Items Addressed This Visit     Preventative health care   High blood pressure    Blood pressure is not at goal for age and co-morbidities.   Recommendations: monitor at home daily or every other day and send Dr. Abner Greenspan a list of readings in a few  weeks  - BP goal <130/80 - monitor and log blood pressures at home - check around the same time each day in a relaxed setting - Limit salt to <2000 mg/day - Follow DASH eating plan (heart healthy diet) - limit alcohol to 2 standard drinks per day for men and 1 per day for  women - avoid tobacco products - get at least 2 hours of regular aerobic exercise weekly Patient aware of signs/symptoms requiring further/urgent evaluation. Labs updated today. *He is unable to come in for nurse recheck due to frequent travels this summer       Relevant Orders   Comprehensive metabolic panel   CBC with Differential/Platelet   Other Visit Diagnoses     Annual physical exam    -  Primary   Relevant Orders   Comprehensive metabolic panel   CBC with Differential/Platelet   Hemoglobin A1c   Lipid panel   Screening for diabetes mellitus       Relevant Orders   Comprehensive metabolic panel   Hemoglobin A1c      Return in about 6 months (around 12/04/2022) for routine follow-up.     Clayborne Dana, NP

## 2022-06-22 ENCOUNTER — Telehealth: Payer: Self-pay | Admitting: Family Medicine

## 2022-06-22 NOTE — Telephone Encounter (Signed)
Pt dropped off copy of Living Will for provider to see and have on pt's chart. Document put at front office tray under providers name.  

## 2022-06-22 NOTE — Telephone Encounter (Signed)
Paperwork will be placed in provider bin to review

## 2022-07-02 ENCOUNTER — Telehealth: Payer: Self-pay

## 2022-07-02 ENCOUNTER — Telehealth: Payer: Self-pay | Admitting: Family Medicine

## 2022-07-02 NOTE — Telephone Encounter (Signed)
done

## 2022-07-02 NOTE — Telephone Encounter (Signed)
Pt also wanted to know if he could pick up his living will.   Medication: methylphenidate (RITALIN) 5 MG tablet  Has the patient contacted their pharmacy? No.   Preferred Pharmacy:   [id number] - [place], [place] - [address], [place] [address] Phone: [phone]  Fax: [phone]

## 2022-07-04 ENCOUNTER — Other Ambulatory Visit: Payer: Self-pay | Admitting: Family Medicine

## 2022-07-04 DIAGNOSIS — F988 Other specified behavioral and emotional disorders with onset usually occurring in childhood and adolescence: Secondary | ICD-10-CM

## 2022-07-04 MED ORDER — METHYLPHENIDATE HCL 5 MG PO TABS: 5.0000 mg | ORAL_TABLET | Freq: Two times a day (BID) | ORAL | 0 refills | Status: DC

## 2022-08-02 ENCOUNTER — Telehealth: Payer: Self-pay | Admitting: Family Medicine

## 2022-08-02 ENCOUNTER — Other Ambulatory Visit: Payer: Self-pay | Admitting: Family Medicine

## 2022-08-02 NOTE — Telephone Encounter (Signed)
Pt states he is in [place] an dis is needing a refill on a controlled substance. Please advise.   Medication: methylphenidate (RITALIN) 5 MG tablet  Has the patient contacted their pharmacy? Yes.     [facility] [address] [place], [place] [address]

## 2022-08-03 ENCOUNTER — Other Ambulatory Visit: Payer: Self-pay | Admitting: Family Medicine

## 2022-08-03 DIAGNOSIS — F988 Other specified behavioral and emotional disorders with onset usually occurring in childhood and adolescence: Secondary | ICD-10-CM

## 2022-08-03 MED ORDER — METHYLPHENIDATE HCL 5 MG PO TABS: 5.0000 mg | ORAL_TABLET | Freq: Two times a day (BID) | ORAL | 0 refills | Status: DC

## 2022-08-04 NOTE — Telephone Encounter (Signed)
Message the pt letting him know, provider will try send it  But pharmacy may not refill it.

## 2022-11-04 ENCOUNTER — Other Ambulatory Visit: Payer: Self-pay | Admitting: Family Medicine

## 2022-11-04 DIAGNOSIS — F988 Other specified behavioral and emotional disorders with onset usually occurring in childhood and adolescence: Secondary | ICD-10-CM

## 2022-11-04 MED ORDER — METHYLPHENIDATE HCL 5 MG PO TABS: 5.0000 mg | ORAL_TABLET | Freq: Two times a day (BID) | ORAL | 0 refills | Status: DC

## 2022-11-04 NOTE — Telephone Encounter (Signed)
Requesting:methylphenidate 5 mg Contract:05/29/2022 UDS:05/29/2022 Last Visit:06/03/2022 Next Visit:None Last Refill:07/04/2022  Please Advise

## 2022-11-04 NOTE — Telephone Encounter (Signed)
Medication: methylphenidate (RITALIN) 5 MG tablet, pt states he usually gets a 10m supply.   Has the patient contacted their pharmacy? Yes.     Preferred Pharmacy:

## 2022-11-08 LAB — BASIC METABOLIC PANEL
BUN: 13 (ref 4–21)
CO2: 104 — AB (ref 13–22)
Creatinine: 0.8 (ref 0.6–1.3)
EGFR: 97
Glucose: 96
Sodium: 142 (ref 137–147)

## 2022-11-08 LAB — HEPATIC FUNCTION PANEL
ALT: 11 U/L (ref 10–40)
AST: 22 (ref 14–40)
Alkaline Phosphatase: 73 (ref 25–125)
Bilirubin, Total: 0.4

## 2022-11-08 LAB — COMPREHENSIVE METABOLIC PANEL
Albumin: 4.5 (ref 3.5–5.0)
Calcium: 8.9 (ref 8.7–10.7)
Globulin: 1.9
eGFR: 97

## 2022-11-08 LAB — TSH: TSH: 1.7 (ref 0.41–5.90)

## 2022-11-08 LAB — CBC: RBC: 4.8 (ref 3.87–5.11)

## 2022-11-08 LAB — LIPID PANEL
Cholesterol: 207 — AB (ref 0–200)
HDL: 61 (ref 35–70)
LDL Cholesterol: 128
Triglycerides: 103 (ref 40–160)

## 2022-11-08 LAB — CBC AND DIFFERENTIAL
HCT: 45 (ref 41–53)
Hemoglobin: 15 (ref 13.5–17.5)
Platelets: 284 10*3/uL (ref 150–400)

## 2022-11-08 LAB — PSA: PSA: 2.4

## 2022-11-12 ENCOUNTER — Other Ambulatory Visit: Payer: Self-pay | Admitting: Family Medicine

## 2022-11-12 DIAGNOSIS — I2510 Atherosclerotic heart disease of native coronary artery without angina pectoris: Secondary | ICD-10-CM

## 2022-11-12 NOTE — Progress Notes (Signed)
ASCVD 18.2. Benign fmhx. Pt is asymptomatic. Not on statin w/ HLD CAC ordered.

## 2022-12-07 ENCOUNTER — Telehealth: Payer: Self-pay | Admitting: Family Medicine

## 2022-12-07 NOTE — Telephone Encounter (Signed)
Prescription Request  12/07/2022  Is this a "Controlled Substance" medicine? No  LOV: Visit date not found  What is the name of the medication or equipment? methylphenidate (RITALIN) 5 MG tablet  Have you contacted your pharmacy to request a refill? No   Which pharmacy would you like this sent to?   [id number] - [place], [place] - [address] [place] [address] Phone: [phone] Fax: [phone]    Patient notified that their request is being sent to the clinical staff for review and that they should receive a response within 2 business days.   Please advise at [facility] [phone]

## 2022-12-08 ENCOUNTER — Other Ambulatory Visit: Payer: Self-pay | Admitting: Family Medicine

## 2022-12-08 DIAGNOSIS — F988 Other specified behavioral and emotional disorders with onset usually occurring in childhood and adolescence: Secondary | ICD-10-CM

## 2022-12-08 MED ORDER — METHYLPHENIDATE HCL 5 MG PO TABS: 5.0000 mg | ORAL_TABLET | Freq: Two times a day (BID) | ORAL | 0 refills | Status: DC

## 2022-12-08 NOTE — Telephone Encounter (Signed)
Called patient. Appointment has been made for next Thursday (12/5) with Hyman Hopes

## 2022-12-08 NOTE — Telephone Encounter (Signed)
Requesting:Ritalin 5 mg Tablet Contract:05/28/2021 UDS:05/28/2021 Last Visit:06/03/2022 (w / Hyman Hopes) Next Visit:None Last Refill:11/04/2022  Please Advise

## 2022-12-10 ENCOUNTER — Ambulatory Visit (HOSPITAL_BASED_OUTPATIENT_CLINIC_OR_DEPARTMENT_OTHER)
Admission: RE | Admit: 2022-12-10 | Discharge: 2022-12-10 | Disposition: A | Discharge status: Home / Self Care | Payer: 59 | Source: Ambulatory Visit | Attending: Family Medicine | Admitting: Family Medicine

## 2022-12-10 DIAGNOSIS — I2510 Atherosclerotic heart disease of native coronary artery without angina pectoris: Secondary | ICD-10-CM | POA: Insufficient documentation

## 2022-12-16 ENCOUNTER — Encounter: Payer: Self-pay | Admitting: Family Medicine

## 2022-12-16 ENCOUNTER — Ambulatory Visit: Payer: 59 | Admitting: Family Medicine

## 2022-12-16 ENCOUNTER — Ambulatory Visit (HOSPITAL_BASED_OUTPATIENT_CLINIC_OR_DEPARTMENT_OTHER)
Admission: RE | Admit: 2022-12-16 | Discharge: 2022-12-16 | Disposition: A | Discharge status: Home / Self Care | Payer: 59 | Source: Ambulatory Visit | Attending: Family Medicine | Admitting: Family Medicine

## 2022-12-16 VITALS — BP 132/80 | HR 60 | Ht 74.0 in | Wt 219.0 lb

## 2022-12-16 DIAGNOSIS — I10 Essential (primary) hypertension: Secondary | ICD-10-CM

## 2022-12-16 DIAGNOSIS — G8929 Other chronic pain: Secondary | ICD-10-CM | POA: Diagnosis present

## 2022-12-16 DIAGNOSIS — M25511 Pain in right shoulder: Secondary | ICD-10-CM | POA: Insufficient documentation

## 2022-12-16 DIAGNOSIS — F988 Other specified behavioral and emotional disorders with onset usually occurring in childhood and adolescence: Secondary | ICD-10-CM

## 2022-12-16 DIAGNOSIS — J309 Allergic rhinitis, unspecified: Secondary | ICD-10-CM | POA: Diagnosis not present

## 2022-12-16 DIAGNOSIS — R739 Hyperglycemia, unspecified: Secondary | ICD-10-CM | POA: Diagnosis not present

## 2022-12-16 LAB — HEMOGLOBIN A1C: Hgb A1c MFr Bld: 6 % (ref 4.6–6.5)

## 2022-12-16 MED ORDER — MONTELUKAST SODIUM 10 MG PO TABS: 10.0000 mg | ORAL_TABLET | Freq: Every day | ORAL | 3 refills | Status: DC

## 2022-12-16 NOTE — Assessment & Plan Note (Signed)
Persistent nasal congestion, not seasonally related. Current treatment with Zyrtec, Flonase, Singulair and nasal strips provides some relief but symptoms persist. -Consider rotating antihistamines every few months -Refer to Asthma and Allergy team for further evaluation and potential allergy testing.

## 2022-12-16 NOTE — Assessment & Plan Note (Signed)
Blood pressure readings averaging in the 140s systolic. Patient is not currently on antihypertensive medication. -Repeat BP today is within goal -Recommend monitoring at home. If high, try stopping Ritalin to see if this helps. Continue heart healthy lifestyle.

## 2022-12-16 NOTE — Assessment & Plan Note (Signed)
Doing well on Ritalin. Continue holding if BP is elevated at home.

## 2022-12-16 NOTE — Progress Notes (Addendum)
Established Patient Office Visit  Subjective   Patient ID: Austin Macdonald, male    DOB: January 27, 1955  Age: 67 y.o. MRN: [id number]  Chief Complaint  Patient presents with   Medical Management of Chronic Issues    HPI   Discussed the use of AI scribe software for clinical note transcription with the patient, who gave verbal consent to proceed.  History of Present Illness   The patient, [name], presented for a routine six-month follow-up appointment.  The patient expressed a decrease in exercise due to recent lifestyle changes and busyness, although he remains generally active, including occasional golfing. His diet remains consistent, incorporating a variety of food groups.  The patient reported a chronic issue with his right shoulder, which he attributed to an injury sustained approximately 50 years ago. The discomfort has been more noticeable over the past six months, but it has not impeded his ability to perform daily tasks, such as carrying groceries. He expressed interest in potentially obtaining an x-ray to investigate the issue further. Overhead motions are particularly painful.  The patient also reported chronic nasal congestion/allergies, which has persisted for years and does not seem to be seasonal. He has tried various treatments, including switching from Claritin to Zyrtec, using Flonase nasal spray, and applying nasal strips, all of which have provided some relief but have not completely resolved the issue. He expressed interest in exploring further treatment options, including potential referral to an asthma and allergy team.  The patient also mentioned a history of snoring and recent use of nasal strips to alleviate this. He reported generally restful sleep, averaging six to seven hours per night. He also mentioned a history of allergies, including to dogs and cats, and previous allergy immunotherapy treatment many years ago.   The patient reported a recent coronary  artery calcium (CAC) scan, which returned a score of zero, indicating a low risk of coronary artery disease. However, he expressed concern about his blood pressure, which has been consistently higher than he would like, averaging in the mid-140s systolic. He noted that his blood pressure seems to be affected by stress and a decrease in physical activity.           ROS All review of systems negative except what is listed in the HPI    Objective:     BP 132/80   Pulse 60   Ht 6\' 2"  (1.88 m)   Wt 219 lb (99.3 kg)   SpO2 97%   BMI 28.12 kg/m    Physical Exam Vitals reviewed.  Constitutional:      Appearance: Normal appearance.  Cardiovascular:     Rate and Rhythm: Normal rate and regular rhythm.     Heart sounds: Normal heart sounds.  Pulmonary:     Effort: Pulmonary effort is normal.     Breath sounds: Normal breath sounds.  Musculoskeletal:        General: Normal range of motion.     Comments: Right shoulder: subacromial tenderness on palpation, normal passive ROM; pain with Hawkins  Skin:    General: Skin is warm and dry.  Neurological:     Mental Status: He is alert and oriented to person, place, and time.  Psychiatric:        Mood and Affect: Mood normal.        Behavior: Behavior normal.        Thought Content: Thought content normal.        Judgment: Judgment normal.  No results found for any visits on 12/16/22.    The 10-year ASCVD risk score (Arnett DK, et al., 2019) is: 16.5%    Assessment & Plan:   Problem List Items Addressed This Visit       Active Problems   Hyperglycemia    Previously stable. Recheck A1c. Continue healthy lifestyle.       Relevant Orders   Hemoglobin A1c   High blood pressure    Blood pressure readings averaging in the 140s systolic. Patient is not currently on antihypertensive medication. -Repeat BP today is within goal -Recommend monitoring at home. If high, try stopping Ritalin to see if this helps. Continue  heart healthy lifestyle.       ADD (attention deficit disorder)    Doing well on Ritalin. Continue holding if BP is elevated at home.       Chronic right shoulder pain    Chronic right shoulder pain, likely due to an old injury. No restriction in movement or stiffness. Pain is constant and has been more noticeable in the past six months. -Order shoulder X-ray. -Refer to Sports Medicine for further evaluation and management. -Home exercise handout provided. Heat, rest, massage. Not interested in meds at this time.      Relevant Orders   DG Shoulder Right   Ambulatory referral to Sports Medicine   Allergic rhinitis - Primary    Persistent nasal congestion, not seasonally related. Current treatment with Zyrtec, Flonase, Singulair and nasal strips provides some relief but symptoms persist. -Consider rotating antihistamines every few months -Refer to Asthma and Allergy team for further evaluation and potential allergy testing.      Relevant Medications   montelukast (SINGULAIR) 10 MG tablet   Other Relevant Orders   Ambulatory referral to Allergy    Declined additional labs today. States recently done at occupational health - he will get a copy to [place].    Return in about 6 months (around 06/16/2023) for physical.    Clayborne Dana, NP

## 2022-12-16 NOTE — Assessment & Plan Note (Signed)
Previously stable. Recheck A1c. Continue healthy lifestyle.

## 2022-12-16 NOTE — Assessment & Plan Note (Signed)
Chronic right shoulder pain, likely due to an old injury. No restriction in movement or stiffness. Pain is constant and has been more noticeable in the past six months. -Order shoulder X-ray. -Refer to Sports Medicine for further evaluation and management. -Home exercise handout provided. Heat, rest, massage. Not interested in meds at this time.

## 2022-12-17 ENCOUNTER — Encounter: Payer: Self-pay | Admitting: Neurology

## 2022-12-17 ENCOUNTER — Ambulatory Visit (INDEPENDENT_AMBULATORY_CARE_PROVIDER_SITE_OTHER): Payer: 59 | Admitting: Family Medicine

## 2022-12-17 VITALS — BP 130/84 | Ht 74.5 in | Wt 215.0 lb

## 2022-12-17 DIAGNOSIS — M7541 Impingement syndrome of right shoulder: Secondary | ICD-10-CM | POA: Diagnosis not present

## 2022-12-17 DIAGNOSIS — M19011 Primary osteoarthritis, right shoulder: Secondary | ICD-10-CM

## 2022-12-17 DIAGNOSIS — M25311 Other instability, right shoulder: Secondary | ICD-10-CM

## 2022-12-17 NOTE — Progress Notes (Unsigned)
CHIEF COMPLAINT: No chief complaint on file.  _____________________________________________________________ SUBJECTIVE  HPI  Pt is a 67 y.o. male here for evaluation of right shoulder pain  Onset a few months ago No Inciting events, possibly yardwork  Daily, doesn't keep him from sleeping or doing anything Pain located anteriorly  Achy sensation with certain movements Swims routinely, but a month ago he went to swimming while doing freestyle, the lift/abduction motion caused some discomfort  Nonradiating, no numbness/tingling Travels frequently, has not noted pain with lifting luggage/ placing things overhead  45 years ago sustained a contact injury, had lost ROM, a friend helped move his shoulder around the next day and felt a difference Didn't bother him again until this spring:  At a [employer] lifting planks, caught the planks from sliding, developed shoulder pain all over in both shoulders, which seemed to self-resolve over a few weeks  No inciting events with recurrence of R shoulder pain  Therapies tried: none so far  ------------------------------------------------------------------------------------------------------ Past Medical History:  Diagnosis Date   ADD (attention deficit disorder) 12/30/2015   Benign paroxysmal positional vertigo 12/26/2014   Chicken pox as a child   Colon polyps    Elevated BP 03/03/2015   Hay fever    seasonal, spring, fall   Hyperglycemia 03/03/2015   Measles as a child   Mumps as a child   Preventative health care 07/25/2012   Tinnitus 12/29/2014    Past Surgical History:  Procedure Laterality Date   knee arthroscopic repair  2004   right      Outpatient Encounter Medications as of 12/17/2022  Medication Sig   cetirizine (ZYRTEC) 10 MG tablet Take 10 mg by mouth daily.   ibuprofen (ADVIL) 200 MG tablet Take 1 tablet (200 mg total) by mouth every 6 (six) hours as needed for pain.   methylphenidate (RITALIN) 5 MG tablet Take 1 tablet  (5 mg total) by mouth 2 (two) times daily with breakfast and lunch.   montelukast (SINGULAIR) 10 MG tablet Take 1 tablet (10 mg total) by mouth at bedtime.   No facility-administered encounter medications on file as of 12/17/2022.    ------------------------------------------------------------------------------------------------------  _____________________________________________________________ OBJECTIVE  PHYSICAL EXAM  Today's Vitals   12/17/22 0946 12/17/22 1031  BP: (!) 142/90 130/84  Weight: 215 lb (97.5 kg)   Height: 6' 2.5" (1.892 m)    Body mass index is 27.24 kg/m.   reviewed  General: A+Ox3, no acute distress, well-nourished, appropriate affect CV: pulses 2+ regular, nondiaphoretic, no peripheral edema, cap refill <2sec Lungs: no audible wheezing, non-labored breathing, bilateral chest rise/fall, nontachypneic Skin: warm, well-perfused, non-icteric, no susp lesions or rashes Neuro: No focal deficits. Sensation intact, muscle tone wnl, no atrophy Psych: no signs of depression or anxiety MSK:     R Shoulder:  No deformity, swelling or muscle wasting No scapular winging, mild scapular dyskinesis FF 180 (discomfort elicited closer to 175 deg), abd 180, int 0, ext 45 symmetrical TTP biceps groove, lesser tuberosity NTTP over the [place], clavicle, ac, coracoid, humerus, deltoid, subacromial space, scap spine, musculature, trap, cervical spine Equivocal neer, +hawkins, +empty can, +scarf test, - hornblower, pain with resisted external rotation, negative belly press however IR shoulder jt reproduces pain,+ speed, neg yergason, equivocal obrien's Anterior drawer reproduces internal discomfort, + sulcus sign +apprehension FROM of neck  12/16/2022 right shoulder x-ray independently reviewed; [facility] joint osteophytic changes, lateral clavicular enthesopathy noted.  Sclerotic and osteophytic changes noted on the glenoid, query mild deviation of GHJ alignment on axillary view. AHD  >  7mm  _____________________________________________________________ ASSESSMENT/PLAN Diagnoses and all orders for this visit:  Arthritis of right acromioclavicular joint  Shoulder instability, right  Shoulder impingement syndrome, right   XR were reviewed with patient, majority of today's visit spent discussing multifactorial etiologies of Alexie's acute on chronic R shoulder pain - ACJ OA, mild GHJ OA findings on XR, suspected biceps tendinopathy, and shoulder instability with signs of impingement identified on exam likely contributed by prior trauma. Overall symptoms have been uncomfortable, but do not keep him up at night, but have been manageable. Unable to necessarily rule out RTC tear, but ROM and strength are preserved and XR do not appear to point to chronic RTC tear arthropathy. Options for management were discussed. PT declined at this time due to frequent travel, but notes he has not been as active. Will proceed with conservative management, otc therapies/activity options discussed. Discussed that in the future may consider CSI to ACJ vs intraarticular, or POCUS to evaluate for biceps tendinopathy as further work-up. All questions answered. Return precautions discussed. Patient verbalized understanding and is in agreement with plan. Anticipate f/u PRN  Electronically signed by: Burna Forts, MD 12/17/2022 7:37 AM

## 2022-12-21 ENCOUNTER — Ambulatory Visit: Payer: 59 | Admitting: Allergy

## 2022-12-21 ENCOUNTER — Encounter: Payer: Self-pay | Admitting: Allergy

## 2022-12-21 VITALS — BP 138/82 | HR 62 | Temp 98.1°F | Resp 16 | Ht 75.0 in | Wt 227.0 lb

## 2022-12-21 DIAGNOSIS — J310 Chronic rhinitis: Secondary | ICD-10-CM

## 2022-12-21 NOTE — Progress Notes (Addendum)
New Patient Note  RE: Austin Macdonald MRN: [id number] DOB: [birth year] Date of Office Visit: 12/21/2022  Consult requested by: Clayborne Dana, NP Primary care provider: Bradd Canary, MD  Chief Complaint: Nasal Congestion  History of Present Illness: I had the pleasure of seeing Austin Macdonald for initial evaluation at the [facility] on 12/30/2022. He is a 67 y.o. male, who is referred here by Bradd Canary, MD for the evaluation of nasal congestion.  Discussed the use of AI scribe software for clinical note transcription with the patient, who gave verbal consent to proceed.  The patient presents with chronic nasal congestion which has been worsening the past two to three years. He describes the congestion as consistent and notes occasional drainage, but denies constant rhinorrhea. Denies sneezing and itching or ocular symptoms. The patient notes that the congestion seems to shift depending on the side he is lying on during sleep.  The patient has a history of allergy testing and treatment approximately fifty years ago, which was primarily for ragweed and dust allergies. He underwent a regimen of allergy shots for two to three years, which he reports was helpful at the time. He has recently started using nasal strips at night, which he finds beneficial.  The patient has been taking Zyrtec since November and uses Flonase once daily in the evening. He also takes Singulair about an hour before bedtime. He reports that these medications may provide slight relief, but the congestion persists. He has also tried an antibiotic gel as recommended by his primary care doctor, but did not find it particularly effective.  The patient has four dogs and two cats at home, and he denies any worsening of symptoms around these pets. He also reports seasonal worsening of symptoms, particularly during spring and fall, and identifies pine as a specific trigger. He denies any associated  symptoms such as headaches, fevers, chills, or changes in appetite. He also denies any history of asthma, food or medication allergies, or significant reactions to insect stings. He reports occasional dry scalp, which he manages with changes in shampoo and conditioner use.  The patient has no history of sinus surgery or polyps, and his last eye exam was in 2019. He denies any issues with heartburn or reflux. He has been monitoring his blood pressure at home, which generally runs around 130/70. He also uses a sinus rinse or neti pot, which he finds helpful.     Anosmia: no. Headache: no. He has used breathing strips, zyrtec, Flonase 1 spray per nostril QHS, Singulair with minimal improvement in symptoms. Sinus infections: no. Previous work up includes: skin testing over 50+ years ago and was AIT for for DM and ragweed for a few years with some benefit.  Previous ENT evaluation: no. History of nasal polyps: no. Last eye exam: 1 yr ago. History of reflux: denies.  12/16/2022 PCP visit: "Persistent nasal congestion, not seasonally related. Current treatment with Zyrtec, Flonase, Singulair and nasal strips provides some relief but symptoms persist. -Consider rotating antihistamines every few months -Refer to Asthma and Allergy team for further evaluation and potential allergy testing."   Assessment and Plan: Austin Macdonald is a 67 y.o. male with: Chronic rhinitis Persistent nasal congestion with some postnasal drainage. No significant sneezing, itchy nose, or itchy watery eyes. Symptoms worsened the past 2-3 years and are year-round with worsening in spring and fall. Previous allergy shots for dust mites and ragweed were beneficial. Currently using Zyrtec, Flonase, and Singulair  with some relief. Return for allergy skin testing. Will make additional recommendations based on results. If positive will recommend AIT. If negative will refer to ENT. Use Flonase (fluticasone) nasal spray 1-2 sprays per nostril  once a day as needed for nasal congestion.  Nasal saline spray (i.e., Simply Saline) or nasal saline lavage (i.e., NeilMed) is recommended as needed and prior to medicated nasal sprays. Continue Singulair (montelukast) 10mg  daily at night.  Return in about 9 days (around 12/30/2022) for Skin testing.  No orders of the defined types were placed in this encounter.  Lab Orders  No laboratory test(s) ordered today    Other allergy screening: Asthma: no Food allergy: no Medication allergy: no Hymenoptera allergy: no Urticaria: no Eczema: on the scalp at times. History of recurrent infections suggestive of immunodeficency: no  Diagnostics: None.   Past Medical History: Patient Active Problem List   Diagnosis Date Noted   Chronic right shoulder pain 12/16/2022   Allergic rhinitis 12/16/2022   Right knee pain 11/24/2021   Plantar fasciitis, bilateral 09/03/2019   Heel spur, left 03/05/2019   Pain of right thumb 03/05/2019   SOM (secretory otitis media) 03/05/2019   Nocturia 08/28/2018   Hyperlipidemia, mixed 05/03/2016   ADD (attention deficit disorder) 12/30/2015   Hyperglycemia 03/03/2015   High blood pressure 03/03/2015   Memory difficulties 12/29/2014   Skin lesion 12/29/2014   Tinnitus 12/29/2014   Benign paroxysmal positional vertigo 12/26/2014   Preventative health care 07/25/2012   Hay fever    Colon polyps    Past Medical History:  Diagnosis Date   ADD (attention deficit disorder) 12/30/2015   Benign paroxysmal positional vertigo 12/26/2014   Chicken pox as a child   Colon polyps    Elevated BP 03/03/2015   Hay fever    seasonal, spring, fall   Hyperglycemia 03/03/2015   Measles as a child   Mumps as a child   Preventative health care 07/25/2012   Tinnitus 12/29/2014   Past Surgical History: Past Surgical History:  Procedure Laterality Date   dental implants     knee arthroscopic repair  01/11/2002   right   Medication List:  Current Outpatient  Medications  Medication Sig Dispense Refill   cetirizine (ZYRTEC) 10 MG tablet Take 10 mg by mouth daily.     ibuprofen (ADVIL) 200 MG tablet Take 1 tablet (200 mg total) by mouth every 6 (six) hours as needed for pain. 30 tablet 0   methylphenidate (RITALIN) 5 MG tablet Take 1 tablet (5 mg total) by mouth 2 (two) times daily with breakfast and lunch. 60 tablet 0   montelukast (SINGULAIR) 10 MG tablet Take 1 tablet (10 mg total) by mouth at bedtime. 30 tablet 3   Olopatadine-Mometasone ([name]) 665-25 MCG/ACT SUSP Place 1-2 sprays into the nose in the morning and at bedtime. 29 g 5   No current facility-administered medications for this visit.   Allergies: No Known Allergies Social History: Social History   Socioeconomic History   Marital status: Married    Spouse name: Not on file   Number of children: Not on file   Years of education: Not on file   Highest education level: Bachelor's degree (e.g., BA, AB, BS)  Occupational History   Not on file  Tobacco Use   Smoking status: Former    Types: Cigarettes    Passive exposure: Past   Smokeless tobacco: Never  Vaping Use   Vaping status: Never Used  Substance and Sexual Activity  Alcohol use: Yes    Comment: 10-12 drinks weekly   Drug use: No   Sexual activity: Yes    Comment: lives with wife, travels for work [place], no major dietary restrictions  Other Topics Concern   Not on file  Social History Narrative   Not on file   Social Drivers of Health   Financial Resource Strain: Low Risk  (12/15/2022)   Overall Financial Resource Strain (CARDIA)    Difficulty of Paying Living Expenses: Not hard at all  Food Insecurity: No Food Insecurity (12/15/2022)   Hunger Vital Sign    Worried About Running Out of Food in the Last Year: Never true    Ran Out of Food in the Last Year: Never true  Transportation Needs: No Transportation Needs (12/15/2022)   PRAPARE - Administrator, Civil Service (Medical): No    Lack of  Transportation (Non-Medical): No  Physical Activity: Insufficiently Active (12/15/2022)   Exercise Vital Sign    Days of Exercise per Week: 3 days    Minutes of Exercise per Session: 40 min  Stress: No Stress Concern Present (12/15/2022)   [employer] of Occupational Health - Occupational Stress Questionnaire    Feeling of Stress : Only a little  Social Connections: Unknown (12/15/2022)   Social Connection and Isolation Panel [NHANES]    Frequency of Communication with Friends and Family: Three times a week    Frequency of Social Gatherings with Friends and Family: Once a week    Attends Religious Services: Patient declined    Database administrator or Organizations: No    Attends Engineer, structural: Not on file    Marital Status: Married   Lives in a 69 year old house. Smoking: denies Occupation: Designer, industrial/product HistorySurveyor, minerals in the house: no Engineer, civil (consulting) in the family room: no Carpet in the bedroom: no Heating: gas Cooling: central Pet: yes 4 dogs x 8 yrs, 3 yrs, 5 yrs, 2 yrs; 2 cats x 5 yrs  Family History: Family History  Problem Relation Age of Onset   Hypertension Mother    Heart disease Mother        CHF   Dementia Father    Other Paternal Grandfather        black lung   Thyroid cancer Daughter    Allergic rhinitis Neg Hx    Angioedema Neg Hx    Atopy Neg Hx    Asthma Neg Hx    Immunodeficiency Neg Hx    Eczema Neg Hx    Urticaria Neg Hx    Review of Systems  Constitutional:  Negative for appetite change, chills, fever and unexpected weight change.  HENT:  Positive for congestion. Negative for rhinorrhea.   Eyes:  Negative for itching.  Respiratory:  Negative for cough, chest tightness, shortness of breath and wheezing.   Cardiovascular:  Negative for chest pain.  Gastrointestinal:  Negative for abdominal pain.  Genitourinary:  Negative for difficulty urinating.  Skin:  Negative for rash.  Neurological:  Negative  for headaches.    Objective: BP 138/82   Pulse 62   Temp 98.1 F (36.7 C) (Temporal)   Resp 16   Ht 6\' 3"  (1.905 m)   Wt 227 lb (103 kg)   SpO2 96%   BMI 28.37 kg/m  Body mass index is 28.37 kg/m. Physical Exam Vitals and nursing note reviewed.  Constitutional:      Appearance: Normal appearance. He is well-developed.  HENT:     Head: Normocephalic and atraumatic.     Right Ear: Tympanic membrane and external ear normal.     Left Ear: Tympanic membrane and external ear normal.     Nose: Nose normal.     Comments: Right side ? polyp    Mouth/Throat:     Mouth: Mucous membranes are moist.     Pharynx: Oropharynx is clear.  Eyes:     Conjunctiva/sclera: Conjunctivae normal.  Cardiovascular:     Rate and Rhythm: Normal rate and regular rhythm.     Heart sounds: Normal heart sounds. No murmur heard.    No friction rub. No gallop.  Pulmonary:     Effort: Pulmonary effort is normal.     Breath sounds: Normal breath sounds. No wheezing, rhonchi or rales.  Musculoskeletal:     Cervical back: Neck supple.  Skin:    General: Skin is warm.     Findings: No rash.  Neurological:     Mental Status: He is alert and oriented to person, place, and time.  Psychiatric:        Behavior: Behavior normal.   The plan was reviewed with the patient/family, and all questions/concerned were addressed.  It was my pleasure to see Austin Macdonald today and participate in his care. Please feel free to contact me with any questions or concerns.  Sincerely,  Wyline Mood, DO Allergy & Immunology  [facility] office: [phone] [facility] office: [phone]

## 2022-12-21 NOTE — Patient Instructions (Addendum)
Rhinitis  Return for allergy skin testing. Will make additional recommendations based on results. Make sure you don't take any antihistamines for 3 days before the skin testing appointment. Don't put any lotion on the back and arms on the day of testing.  Plan on being here for 30-60 minutes.   Use Flonase (fluticasone) nasal spray 1-2 sprays per nostril once a day as needed for nasal congestion.  Nasal saline spray (i.e., Simply Saline) or nasal saline lavage (i.e., NeilMed) is recommended as needed and prior to medicated nasal sprays. Continue Singulair (montelukast) 10mg  daily at night.  Elevated blood pressure  Blood pressure reading was high in our office today. Vitals:   12/21/22 0833  BP: (!) 146/88  Please follow up with PCP regarding this.    Follow up for skin testing.

## 2022-12-23 ENCOUNTER — Encounter: Payer: Self-pay | Admitting: Allergy

## 2022-12-27 ENCOUNTER — Telehealth: Payer: Self-pay | Admitting: Family Medicine

## 2022-12-27 NOTE — Telephone Encounter (Signed)
Pt states he got a message he is due for a lung screening and would like to know if he should get this done. Pt states he smoked back in his teen years but has not smoked in over 50 years. If so, please order to [facility].

## 2022-12-27 NOTE — Telephone Encounter (Signed)
 Called patient. Instructions given per provider's notes

## 2022-12-30 ENCOUNTER — Ambulatory Visit: Payer: 59 | Admitting: Allergy

## 2022-12-30 ENCOUNTER — Encounter: Payer: Self-pay | Admitting: Allergy

## 2022-12-30 ENCOUNTER — Telehealth: Payer: Self-pay

## 2022-12-30 DIAGNOSIS — J301 Allergic rhinitis due to pollen: Secondary | ICD-10-CM

## 2022-12-30 DIAGNOSIS — J3089 Other allergic rhinitis: Secondary | ICD-10-CM

## 2022-12-30 DIAGNOSIS — J3081 Allergic rhinitis due to animal (cat) (dog) hair and dander: Secondary | ICD-10-CM

## 2022-12-30 MED ORDER — RYALTRIS 665-25 MCG/ACT NA SUSP: 1.0000 | Freq: Two times a day (BID) | NASAL | 5 refills | Status: DC

## 2022-12-30 NOTE — Patient Instructions (Addendum)
Today's skin testing positive to grass, weed, ragweed, dust mites, cat, dog.  Results given.  Environmental allergies Start environmental control measures as below. Use over the counter antihistamines such as Zyrtec (cetirizine), Claritin (loratadine), Allegra (fexofenadine), or Xyzal (levocetirizine) daily as needed. May take twice a day during allergy flares. May switch antihistamines every few months. Start [name] (olopatadine + mometasone nasal spray combination) 1-2 sprays per nostril twice a day. Sample given. This replaces your other nasal sprays. If this works well for you, then have pharmacy ship the medication to your home - prescription already sent in.  Nasal saline spray (i.e., Simply Saline) or nasal saline lavage (i.e., NeilMed) is recommended as needed and prior to medicated nasal sprays. Continue Singulair (montelukast) 10mg  daily at night. Consider allergy injections for long term control if above medications do not help the symptoms - handout given.  Let us know when ready to start. 2 injections.   Return in about 6 months (around 06/30/2023). Or sooner if needed.   Reducing Pollen Exposure Pollen seasons: trees (spring), grass (summer) and ragweed/weeds (fall). Keep windows closed in your home and car to lower pollen exposure.  Install air conditioning in the bedroom and throughout the house if possible.  Avoid going out in dry windy days - especially early morning. Pollen counts are highest between 5 - 10 AM and on dry, hot and windy days.  Save outside activities for late afternoon or after a heavy rain, when pollen levels are lower.  Avoid mowing of grass if you have grass pollen allergy. Be aware that pollen can also be transported indoors on people and pets.  Dry your clothes in an automatic dryer rather than hanging them outside where they might collect pollen.  Rinse hair and eyes before bedtime.  Control of House Dust Mite Allergen Dust mite allergens are a  common trigger of allergy and asthma symptoms. While they can be found throughout the house, these microscopic creatures thrive in warm, humid environments such as bedding, upholstered furniture and carpeting. Because so much time is spent in the bedroom, it is essential to reduce mite levels there.  Encase pillows, mattresses, and box springs in special allergen-proof fabric covers or airtight, zippered plastic covers.  Bedding should be washed weekly in hot water (130 F) and dried in a hot dryer. Allergen-proof covers are available for comforters and pillows that can't be regularly washed.  Wash the allergy-proof covers every few months. Minimize clutter in the bedroom. Keep pets out of the bedroom.  Keep humidity less than 50% by using a dehumidifier or air conditioning. You can buy a humidity measuring device called a hygrometer to monitor this.  If possible, replace carpets with hardwood, linoleum, or washable area rugs. If that's not possible, vacuum frequently with a vacuum that has a HEPA filter. Remove all upholstered furniture and non-washable window drapes from the bedroom. Remove all non-washable stuffed toys from the bedroom.  Wash stuffed toys weekly. Pet Allergen Avoidance: Contrary to popular opinion, there are no "hypoallergenic" breeds of dogs or cats. That is because people are not allergic to an animal's hair, but to an allergen found in the animal's saliva, dander (dead skin flakes) or urine. Pet allergy symptoms typically occur within minutes. For some people, symptoms can build up and become most severe 8 to 12 hours after contact with the animal. People with severe allergies can experience reactions in public places if dander has been transported on the pet owners' clothing. Keeping an animal outdoors is  only a partial solution, since homes with pets in the yard still have higher concentrations of animal allergens. Before getting a pet, ask your allergist to determine if you are  allergic to animals. If your pet is already considered part of your family, try to minimize contact and keep the pet out of the bedroom and other rooms where you spend a great deal of time. As with dust mites, vacuum carpets often or replace carpet with a hardwood floor, tile or linoleum. High-efficiency particulate air (HEPA) cleaners can reduce allergen levels over time. While dander and saliva are the source of cat and dog allergens, urine is the source of allergens from rabbits, hamsters, mice and [place] pigs; so ask a non-allergic family member to clean the animal's cage. If you have a pet allergy, talk to your allergist about the potential for allergy immunotherapy (allergy shots). This strategy can often provide long-term relief.

## 2022-12-30 NOTE — Progress Notes (Signed)
Skin testing note  RE: JACAIDEN DUXBURY MRN: [id number] DOB: 1955/08/08 Date of Office Visit: 12/30/2022  Referring provider: Bradd Canary, MD Primary care provider: Bradd Canary, MD  Chief Complaint: skin testing  History of Present Illness: I had the pleasure of seeing Stefanos Limbrick for a skin testing visit at the [facility] on 12/30/2022. He is a 67 y.o. male, who is being followed for rhinitis. His previous allergy office visit was on 12/21/2022 with Dr. Selena Batten. Today is a skin testing visit.   Discussed the use of AI scribe software for clinical note transcription with the patient, who gave verbal consent to proceed.  The patient's primary complaint is of postnasal drip and congestion, which he attributes to his allergies. He reports that his symptoms are less noticeable during the day when he is active, but become more pronounced at night. He has found relief from using nasal strips, which he believes have been the most effective treatment for his symptoms to date.  The patient also mentions that he has previously undergone allergy shots for ragweed and dust mites, but this was in the late 1960s and he is unsure of the long-term effectiveness. He expresses a willingness to consider allergy shots again, but also expresses concerns about the commitment required for the treatment. He is considering retirement within the next year.  The patient notes miscommunication in his medical record indicating a long-term smoking history. He clarifies that he experimented with smoking as a teenager, but this was over fifty years ago and he has not smoked since.     Assessment and Plan: Griffey is a 67 y.o. male with: Other allergic rhinitis Seasonal allergic rhinitis due to pollen Allergic rhinitis due to dust mite Allergic rhinitis due to animal dander Past history - persistent nasal congestion with some postnasal drainage. No significant sneezing, itchy nose, or itchy  watery eyes. Symptoms worsened the past 2-3 years and are year-round with worsening in spring and fall. Previous allergy shots for dust mites and ragweed were beneficial. Currently using Zyrtec, Flonase, and Singulair with some relief. Today's skin testing positive to grass, weed, ragweed, dust mites, cat, dog. Start environmental control measures as below. Use over the counter antihistamines such as Zyrtec (cetirizine), Claritin (loratadine), Allegra (fexofenadine), or Xyzal (levocetirizine) daily as needed. May take twice a day during allergy flares. May switch antihistamines every few months. Start [name] (olopatadine + mometasone nasal spray combination) 1-2 sprays per nostril twice a day. Sample given. This replaces your other nasal sprays. If this works well for you, then have pharmacy ship the medication to your home - prescription already sent in.  Nasal saline spray (i.e., Simply Saline) or nasal saline lavage (i.e., NeilMed) is recommended as needed and prior to medicated nasal sprays. Continue Singulair (montelukast) 10mg  daily at night. Consider allergy injections for long term control if above medications do not help the symptoms - handout given.  Let us know when ready to start. 2 injections.   Return in about 6 months (around 06/30/2023).  Meds ordered this encounter  Medications   Olopatadine-Mometasone ([name]) 665-25 MCG/ACT SUSP    Sig: Place 1-2 sprays into the nose in the morning and at bedtime.    Dispense:  29 g    Refill:  5    [phone]   Lab Orders  No laboratory test(s) ordered today    Diagnostics: Skin Testing: Environmental allergy panel. Today's skin testing positive to grass, weed, ragweed, dust mites, cat,  dog. Results discussed with patient/family.  Airborne Adult Perc - 12/30/22 1030     Time Antigen Placed 1030    Allergen Manufacturer Greer    Location Back    Number of Test 55    1. Control-Buffer 50% Glycerol Negative    2.  Control-Histamine 3+    3. Bahia 4+    4. [place] 2+    5. Johnson 3+    6. Kentucky Blue 4+    7. Meadow Fescue 4+    8. Perennial Rye 4+    9. Timothy 4+    10. Ragweed Mix 4+    11. Cocklebur 3+    12. Plantain,  English Negative    13. Baccharis 2+    14. Dog Fennel 2+    15. Russian Thistle Negative    16. Lamb's Quarters 2+    17. Sheep Sorrell Negative    18. Rough Pigweed 2+    19. Marsh Elder, Rough 2+    20. Mugwort, Common 4+    21. Box, Elder --   +/-   22. Cedar, red Negative    23. Sweet Gum 2+    24. Pecan Pollen 2+    25. Pine Mix Negative    26. Walnut, Black Pollen 2+    27. Red Mulberry 2+    28. Ash Mix 2+    29. Birch Mix 2+    30. Beech American 2+    31. Cottonwood, Guinea-Bissau Negative    32. Hickory, White Negative    33. Maple Mix Negative    34. Oak, Guinea-[place] Mix 3+    35. Sycamore Eastern 2+    36. Alternaria Alternata Negative    37. Cladosporium Herbarum Negative    38. Aspergillus Mix Negative    39. Penicillium Mix Negative    40. Bipolaris Sorokiniana (Helminthosporium) Negative    41. Drechslera Spicifera (Curvularia) Negative    42. Mucor Plumbeus Negative    43. Fusarium Moniliforme Negative    44. Aureobasidium Pullulans (pullulara) Negative    45. Rhizopus Oryzae Negative    46. Botrytis Cinera Negative    47. Epicoccum Nigrum Negative    48. Phoma Betae Negative    49. Dust Mite Mix 4+    50. Cat Hair 10,000 BAU/ml 2+    51.  Dog Epithelia Negative    52. Mixed Feathers Negative    53. Horse Epithelia Negative    54. Cockroach, German Negative    55. Tobacco Leaf Negative             Intradermal - 12/30/22 1051     Time Antigen Placed 1050    Allergen Manufacturer Greer    Location Arm    Number of Test 7    Control Negative    Mold 1 Negative    Mold 2 Negative    Mold 3 Negative    Mold 4 Negative    Dog 2+    Cockroach Negative             Previous notes and tests were reviewed. The plan was reviewed  with the patient/family, and all questions/concerned were addressed.  It was my pleasure to see Yehudah today and participate in his care. Please feel free to contact me with any questions or concerns.  Sincerely,  Wyline Mood, DO Allergy & Immunology  [facility] office: [phone] [facility] office: [phone]

## 2022-12-30 NOTE — Telephone Encounter (Signed)
[facility] stating [name] needs a PA. Patient has tried flonase. Covermymeds key: [id number]. Please initiate PA for ryaltris.

## 2022-12-31 ENCOUNTER — Telehealth: Payer: Self-pay

## 2022-12-31 NOTE — Telephone Encounter (Signed)
PA request has been Submitted. New Encounter created for follow up. For additional info see Pharmacy Prior Auth telephone encounter from 12/20.

## 2022-12-31 NOTE — Telephone Encounter (Signed)
*  Asthma/Allergy  Pharmacy Patient Advocate Encounter   Received notification from Pt Calls Messages that prior authorization for [name] 665-25MCG/ACT suspension  is required/requested.   Insurance verification completed.   The patient is insured through [facility] .   Per test claim: PA required; PA submitted to above mentioned insurance via CoverMyMeds Key/confirmation #/EOC [facility] Status is pending

## 2022-12-31 NOTE — Telephone Encounter (Signed)
Pharmacy Patient Advocate Encounter  Received notification from [facility] that Prior Authorization for [name] has been DENIED.  See denial reason below. No denial letter attached in CMM. Will attach denial letter to Media tab once received.   PA #/Case ID/Reference #: Your plan only covers this drug when you meet one of these options: A) You have tried other drugs your plan covers (preferred drugs), and they did not work well for you, or B) Your doctor gives [place] a medical reason you cannot take those other drugs. For your plan, you may need to try up to three preferred drugs. We have denied your request because you do not meet any of these conditions. We reviewed the information we had. Your request has been denied. Your doctor can send [place] any new or missing information for [place] to review. The preferred drugs for your plan are: azelastine-fluticasone, flunisolide, fluticasone, mometasone. (Requirement: 3 in a class with 3 or more alternatives, 2 in a class with 2 alternatives, or 1 in a class with only 1 alternative.). Your doctor may need to get approval from your plan for preferred drugs. For this drug, you may have to meet other criteria. You can request the drug policy for more details. You can also request other plan documents for your review.

## 2023-01-02 MED ORDER — AZELASTINE-FLUTICASONE 137-50 MCG/ACT NA SUSP: 1.0000 | Freq: Two times a day (BID) | NASAL | 5 refills | Status: DC

## 2023-01-02 NOTE — Addendum Note (Signed)
Addended by: Ellamae Sia on: 01/02/2023 11:29 PM   Modules accepted: Orders

## 2023-01-02 NOTE — Telephone Encounter (Signed)
Please call patient and let him know that [name] was not covered.  Start dymista (fluticasone + azelastine nasal spray combination) 1 spray per nostril twice a day. This replaces the other nasal sprays. Nasal saline spray (i.e., Simply Saline) or nasal saline lavage (i.e., NeilMed) is recommended as needed and prior to medicated nasal sprays.

## 2023-01-03 ENCOUNTER — Other Ambulatory Visit: Payer: Self-pay | Admitting: Family Medicine

## 2023-01-03 ENCOUNTER — Other Ambulatory Visit: Payer: Self-pay

## 2023-01-03 DIAGNOSIS — F988 Other specified behavioral and emotional disorders with onset usually occurring in childhood and adolescence: Secondary | ICD-10-CM

## 2023-01-03 MED ORDER — METHYLPHENIDATE HCL 5 MG PO TABS: 5.0000 mg | ORAL_TABLET | Freq: Two times a day (BID) | ORAL | 0 refills | Status: DC

## 2023-01-03 NOTE — Telephone Encounter (Signed)
Patient called and I relayed the message from Dr. Selena Batten. Patient verbalized understanding and agreed to pick medication up.

## 2023-01-03 NOTE — Telephone Encounter (Signed)
Requesting: ritalin Contract: n/a UDS:05/28/21 Last Visit:12/16/22 Next Visit:n/a Last Refill:12/08/22  Please Advise              Copied from CRM #[id number]. Topic: Clinical - Prescription Issue >> Jan 03, 2023  9:57 AM Irine Seal wrote: Reason for CRM: epic is not routing rx refills properly    Most Recent Primary Care Visit:  Provider: Clayborne Dana  Department: [facility]  Visit Type: OFFICE VISIT  Date: 12/16/2022  Medication: methylphenidate (RITALIN) 5 MG  Has the patient contacted their pharmacy? Yes (Agent: If no, request that the patient contact the pharmacy for the refill. If patient does not wish to contact the pharmacy document the reason why and proceed with request.) (Agent: If yes, when and what did the pharmacy advise?)  Is this the correct pharmacy for this prescription? Yes If no, delete pharmacy and type the correct one.  This is the patient's preferred pharmacy:  [id number] [place], [place] - [address] [place] [address] Phone: [phone] Fax: [phone]    Has the prescription been filled recently? Yes  Is the patient out of the medication? No  Has the patient been seen for an appointment in the last year OR does the patient have an upcoming appointment? Yes  Can we respond through MyChart? Yes  Agent: Please be advised that Rx refills may take up to 3 business days. We ask that you follow-up with your pharmacy.

## 2023-01-03 NOTE — Telephone Encounter (Signed)
Copied from CRM [id number]. Topic: Clinical - Medication Refill >> Jan 03, 2023  9:54 AM Irine Seal wrote: Most Recent Primary Care Visit:  Provider: Clayborne Dana  Department: [facility]  Visit Type: OFFICE VISIT  Date: 12/16/2022  Medication: ***  Has the patient contacted their pharmacy?  (Agent: If no, request that the patient contact the pharmacy for the refill. If patient does not wish to contact the pharmacy document the reason why and proceed with request.) (Agent: If yes, when and what did the pharmacy advise?)  Is this the correct pharmacy for this prescription?  If no, delete pharmacy and type the correct one.  This is the patient's preferred pharmacy:  [id number] [place], [place] - [address] [place] [address] Phone: [phone] Fax: [phone]  [id number] [place] - [address] [place] [address] Phone: [phone] Fax: [phone]  [facility] - [place], [place] - [address] [place] [address] Phone: [phone] Fax: [phone]   Has the prescription been filled recently?   Is the patient out of the medication?   Has the patient been seen for an appointment in the last year OR does the patient have an upcoming appointment?   Can we respond through MyChart?   Agent: Please be advised that Rx refills may take up to 3 business days. We ask that you follow-up with your pharmacy.

## 2023-01-03 NOTE — Telephone Encounter (Signed)
I called patient and left a message to call the office back to inform of medication.

## 2023-02-07 ENCOUNTER — Other Ambulatory Visit: Payer: Self-pay | Admitting: Family Medicine

## 2023-02-07 DIAGNOSIS — F988 Other specified behavioral and emotional disorders with onset usually occurring in childhood and adolescence: Secondary | ICD-10-CM

## 2023-02-07 NOTE — Telephone Encounter (Signed)
Copied from CRM [id number]. Topic: Clinical - Medication Refill >> Feb 07, 2023  4:28 PM Austin Macdonald wrote: Most Recent Primary Care Visit:  Provider: Clayborne Dana  Department: [facility]  Visit Type: OFFICE VISIT  Date: 12/16/2022  Medication: methylphenidate (RITALIN) 5 MG tablet   Has the patient contacted their pharmacy? No (Agent: If no, request that the patient contact the pharmacy for the refill. If patient does not wish to contact the pharmacy document the reason why and proceed with request.) Pt stated that the pharmacy told him to contact us  (Agent: If yes, when and what did the pharmacy advise?)  Is this the correct pharmacy for this prescription? Yes If no, delete pharmacy and type the correct one.  This is the patient's preferred pharmacy:  [id number] [place], [place] - [address] [place] [address] Phone: [phone] Fax: [phone]  Has the prescription been filled recently? Yes  Is the patient out of the medication? Yes  Has the patient been seen for an appointment in the last year OR does the patient have an upcoming appointment? Yes  Can we respond through MyChart? Yes  Agent: Please be advised that Rx refills may take up to 3 business days. We ask that you follow-up with your pharmacy.

## 2023-02-07 NOTE — Telephone Encounter (Signed)
Last Fill: 01/03/23  Last OV: 11/25/22 Next OV: None Scheduled  Routing to provider for review/authorization.

## 2023-02-08 MED ORDER — METHYLPHENIDATE HCL 5 MG PO TABS: 5.0000 mg | ORAL_TABLET | Freq: Two times a day (BID) | ORAL | 0 refills | Status: DC

## 2023-02-08 NOTE — Telephone Encounter (Signed)
Requesting: Ritalin 5mg   Contract: 09/15/2018 UDS: 05/28/21 Last Visit: 12/16/22 w/ Ladona Ridgel Next Visit: None Last Refill: 01/03/23 #60 and 0RF   Please Advise

## 2023-03-15 ENCOUNTER — Other Ambulatory Visit: Payer: Self-pay | Admitting: Family Medicine

## 2023-03-15 DIAGNOSIS — F988 Other specified behavioral and emotional disorders with onset usually occurring in childhood and adolescence: Secondary | ICD-10-CM

## 2023-03-15 NOTE — Telephone Encounter (Signed)
 Requesting: Ritalin 5mg   Contract: 09/15/2018 UDS: 05/28/21 Last Visit: 12/16/22 w/ Ladona Ridgel Next Visit: None Last Refill: 02/08/23 #60 and 0RF   Please Advise

## 2023-03-15 NOTE — Telephone Encounter (Signed)
 Copied from CRM [id number]. Topic: Clinical - Medication Refill >> Mar 15, 2023  1:47 PM Hector Shade B wrote: Most Recent Primary Care Visit:  Provider: Clayborne Dana  Department: [facility]  Visit Type: OFFICE VISIT  Date: 12/16/2022  Medication: methylphenidate (RITALIN) 5 MG tablet  Has the patient contacted their pharmacy? No (Agent: If no, request that the patient contact the pharmacy for the refill. If patient does not wish to contact the pharmacy document the reason why and proceed with request.)He states he's aware that if the provider has not sent in 3 months worth of prescriptions due to the nature of the medication he has to call it in for refill  (Agent: If yes, when and what did the pharmacy advise?)  Is this the correct pharmacy for this prescription? Yes If no, delete pharmacy and type the correct one.  This is the patient's preferred pharmacy:  [id number] [place], [place] - [address] [place] [address] Phone: [phone] Fax: [phone]  [id number] [place], [place] - [address] [place] [address] Phone: [phone] Fax: [phone]  [facility] - [place], [place] - [address] [place] [address] Phone: [phone] Fax: [phone]   Has the prescription been filled recently? Yes  Is the patient out of the medication? Yes  Has the patient been seen for an appointment in the last year OR does the patient have an upcoming appointment? Yes 06/14/23  Can we respond through MyChart? Yes  Agent: Please be advised that Rx refills may take up to 3 business days. We ask that you follow-up with your pharmacy.

## 2023-03-16 MED ORDER — METHYLPHENIDATE HCL 5 MG PO TABS
5.0000 mg | ORAL_TABLET | Freq: Two times a day (BID) | ORAL | 0 refills | Status: DC
Start: 2023-03-16 — End: 2023-04-25

## 2023-03-23 ENCOUNTER — Ambulatory Visit (HOSPITAL_BASED_OUTPATIENT_CLINIC_OR_DEPARTMENT_OTHER)
Admission: RE | Admit: 2023-03-23 | Discharge: 2023-03-23 | Disposition: A | Discharge status: Home / Self Care | Source: Ambulatory Visit | Attending: Family Medicine | Admitting: Family Medicine

## 2023-03-23 ENCOUNTER — Encounter: Payer: Self-pay | Admitting: Family Medicine

## 2023-03-23 DIAGNOSIS — M25561 Pain in right knee: Secondary | ICD-10-CM | POA: Insufficient documentation

## 2023-03-23 DIAGNOSIS — G8929 Other chronic pain: Secondary | ICD-10-CM

## 2023-04-11 ENCOUNTER — Encounter: Payer: Self-pay | Admitting: Family Medicine

## 2023-04-25 ENCOUNTER — Other Ambulatory Visit: Payer: Self-pay | Admitting: Family Medicine

## 2023-04-25 DIAGNOSIS — F988 Other specified behavioral and emotional disorders with onset usually occurring in childhood and adolescence: Secondary | ICD-10-CM

## 2023-04-25 MED ORDER — METHYLPHENIDATE HCL 5 MG PO TABS: 5.0000 mg | ORAL_TABLET | Freq: Two times a day (BID) | ORAL | 0 refills | Status: DC

## 2023-04-25 NOTE — Telephone Encounter (Signed)
 Copied from CRM [id number]. Topic: Clinical - Medication Refill >> Apr 25, 2023  9:33 AM Albertha Alosa wrote: Most Recent Primary Care Visit:  Provider: Everlina Hock  Department: [facility]  Visit Type: OFFICE VISIT  Date: 12/16/2022  Medication: methylphenidate (RITALIN) 5 MG tablet  Has the patient contacted their pharmacy? Yes (Agent: If no, request that the patient contact the pharmacy for the refill. If patient does not wish to contact the pharmacy document the reason why and proceed with request.) (Agent: If yes, when and what did the pharmacy advise?)  Is this the correct pharmacy for this prescription? Yes If no, delete pharmacy and type the correct one.  This is the patient's preferred pharmacy:  [id number] [place], [place] - [address] [place] [address] Phone: [phone] Fax: [phone]  [id number] [place], [place] - [address] [place] [address] Phone: [phone] Fax: [phone]  [facility] - [place], [place] - [address] [place] [address] Phone: [phone] Fax: [phone]   Has the prescription been filled recently? No  Is the patient out of the medication? Yes  Has the patient been seen for an appointment in the last year OR does the patient have an upcoming appointment? Yes  Can we respond through MyChart? Yes  Agent: Please be advised that Rx refills may take up to 3 business days. We ask that you follow-up with your pharmacy.

## 2023-04-25 NOTE — Telephone Encounter (Signed)
 Requesting: Ritalin 5mg   Contract: None UDS: 05/28/21 Last Visit: 12/16/22 w/ Carolynne Citron Next Visit: None Last Refill: 03/16/23 #60 and 0RF   Please Advise

## 2023-05-27 ENCOUNTER — Other Ambulatory Visit: Payer: Self-pay | Admitting: Family Medicine

## 2023-05-27 DIAGNOSIS — F988 Other specified behavioral and emotional disorders with onset usually occurring in childhood and adolescence: Secondary | ICD-10-CM

## 2023-05-27 MED ORDER — METHYLPHENIDATE HCL 5 MG PO TABS: 5.0000 mg | ORAL_TABLET | Freq: Two times a day (BID) | ORAL | 0 refills | Status: DC

## 2023-05-27 NOTE — Telephone Encounter (Signed)
 Requesting: ritalin  Contract:n/a UDS:n/a Last Visit:12/16/22 Next Visit:n/a Last Refill:04/25/23  Please Advise

## 2023-05-27 NOTE — Telephone Encounter (Signed)
 Copied from CRM [id number]. Topic: Clinical - Medication Refill >> May 27, 2023 11:35 AM [name] wrote: Medication: methylphenidate  (RITALIN ) 5 MG tablet  Has the patient contacted their pharmacy? No (Agent: If no, request that the patient contact the pharmacy for the refill. If patient does not wish to contact the pharmacy document the reason why and proceed with request.) (Agent: If yes, when and what did the pharmacy advise?)  This is the patient's preferred pharmacy:  [id number] [place], [place] - [address] [place] [address] Phone: [phone] Fax: [phone]  Is this the correct pharmacy for this prescription? Yes If no, delete pharmacy and type the correct one.   Has the prescription been filled recently? No  Is the patient out of the medication? Yes  Has the patient been seen for an appointment in the last year OR does the patient have an upcoming appointment? Yes  Can we respond through MyChart? Yes  Agent: Please be advised that Rx refills may take up to 3 business days. We ask that you follow-up with your pharmacy.

## 2023-06-03 ENCOUNTER — Other Ambulatory Visit: Payer: Self-pay | Admitting: Family Medicine

## 2023-06-03 DIAGNOSIS — J309 Allergic rhinitis, unspecified: Secondary | ICD-10-CM

## 2023-06-10 LAB — HM COLONOSCOPY

## 2023-06-13 NOTE — Progress Notes (Signed)
 Follow Up Note  RE: Austin Macdonald MRN: [id number] DOB: 04/03/1955 Date of Office Visit: 06/14/2023  Referring provider: Neda Balk, MD Primary care provider: Neda Balk, MD  Chief Complaint: Allergic Rhinitis  (No change since last visit - still has congestion ) and Other (Would like to know if he should continue with singular - can't tell a difference while using it)  History of Present Illness: I had the pleasure of seeing Austin Macdonald for [facility]. He is a 68 y.o. male, who is being followed for allergic rhinitis. His previous allergy  office visit was on 12/30/2022 with Dr. Burdette Carolin. Today is a regular follow up visit.  Discussed the use of AI scribe software for clinical note transcription with the patient, who gave verbal consent to proceed.    He experiences persistent nasal congestion without rhinorrhea, questioning whether his sinuses are ever empty. He uses a nasal irrigation and a combination nasal spray containing azelastine  and fluticasone , but finds only partial relief. He uses the nasal spray once or twice daily, depending on his memory and convenience.  He takes Singulair  (montelukast ) at night - noting that he is 'eighty to ninety percent compliant.' He is also rotating between Allegra and levocetirizine, starting the latter tomorrow. He finds Allegra more effective than Zyrtec. He experiences congestion rather than drainage, with a sensation of fullness in his sinuses.  He has not seen an ENT recently but recalls a childhood incident where a high jump bar fell on his nose, which he wonders might have caused structural issues.  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 His work schedule is variable, affecting his ability to maintain a consistent routine for allergy  shots.     Assessment and Plan: [name] is a 68 y.o. male with: Seasonal allergic rhinitis due  to pollen Allergic rhinitis due to dust mite Allergic rhinitis due to animal dander Past history - Previous allergy  shots for dust mites and ragweed were beneficial. 2024 skin testing positive to grass, weed, ragweed, dust mites, cat, dog. Interim history - Partial relief with current treatment. Discussed potential for immunotherapy post-ENT evaluation. [name]  partially effective.  Continue environmental control measures as below. Use over the counter antihistamines such as Zyrtec (cetirizine), Claritin (loratadine), Allegra (fexofenadine), or Xyzal (levocetirizine) daily as needed. May take twice a day during allergy  flares. May switch antihistamines every few months. Start Xhance (fluticasone ) nasal spray 1-2 sprays per nostril twice a day as needed for nasal congestion.  This replaces your other nasal sprays.  Sample given and demonstrated proper use. If this is not covered let us  know and go back to your other nasal spray. This will be mailed to you from [facility] pharmacy [phone]). Nasal saline spray (i.e., Simply Saline) or nasal saline lavage (i.e., NeilMed) is recommended as needed and prior to medicated nasal sprays. Continue Singulair  (montelukast ) 10mg  daily at night. Consider allergy  injections for long term control if above medications do not help the symptoms. 2 injections.  Refer to ENT for chronic nasal congestion - to rule out any anatomical issues.  Return in about 4 months (around 10/14/2023).  Meds ordered this encounter  Medications   montelukast  (SINGULAIR ) 10 MG tablet    Sig: Take 1 tablet (10 mg total) by mouth at bedtime.    Dispense:  90 tablet    Refill:  3   Fluticasone  Propionate (XHANCE) 93  MCG/ACT EXHU    Sig: 1-2 sprays per nostril twice a day.    Dispense:  16 mL    Refill:  5   Lab Orders  No laboratory test(s) ordered today    Diagnostics: None.   Medication List:  Current Outpatient Medications  Medication Sig Dispense Refill    Fluticasone  Propionate (XHANCE) 93 MCG/ACT EXHU 1-2 sprays per nostril twice a day. 16 mL 5   ibuprofen  (ADVIL ) 200 MG tablet Take 1 tablet (200 mg total) by mouth every 6 (six) hours as needed for pain. 30 tablet 0   levocetirizine (XYZAL) 5 MG tablet Take 5 mg by mouth every evening.     methylphenidate  (RITALIN ) 5 MG tablet Take 1 tablet (5 mg total) by mouth 2 (two) times daily with breakfast and lunch. 60 tablet 0   montelukast  (SINGULAIR ) 10 MG tablet Take 1 tablet (10 mg total) by mouth at bedtime. 90 tablet 3   No current facility-administered medications for this visit.   Allergies: No Known Allergies I reviewed his past medical history, social history, family history, and environmental history and no significant changes have been reported from his previous visit.  Review of Systems  Constitutional:  Negative for appetite change, chills, fever and unexpected weight change.  HENT:  Positive for congestion. Negative for rhinorrhea.   Eyes:  Negative for itching.  Respiratory:  Negative for cough, chest tightness, shortness of breath and wheezing.   Cardiovascular:  Negative for chest pain.  Gastrointestinal:  Negative for abdominal pain.  Genitourinary:  Negative for difficulty urinating.  Skin:  Negative for rash.  Allergic/Immunologic: Positive for environmental allergies.  Neurological:  Negative for headaches.    Objective: BP 138/86 (BP Location: Right Arm, Patient Position: Sitting, Cuff Size: Normal)   Pulse 62   Temp 98 F (36.7 C) (Temporal)   Resp 18   Ht 6' 1.23" (1.86 m)   Wt 215 lb 12.8 oz (97.9 kg)   SpO2 98%   BMI 28.29 kg/m  Body mass index is 28.29 kg/m. Physical Exam Vitals and nursing note reviewed.  Constitutional:      Appearance: Normal appearance. He is well-developed.  HENT:     Head: Normocephalic and atraumatic.     Right Ear: Tympanic membrane and external ear normal.     Left Ear: Tympanic membrane and external ear normal.     Nose:  Nose normal.     Comments: Right side ? polyp    Mouth/Throat:     Mouth: Mucous membranes are moist.     Pharynx: Oropharynx is clear.  Eyes:     Conjunctiva/sclera: Conjunctivae normal.  Cardiovascular:     Rate and Rhythm: Normal rate and regular rhythm.     Heart sounds: Normal heart sounds. No murmur heard.    No friction rub. No gallop.  Pulmonary:     Effort: Pulmonary effort is normal.     Breath sounds: Normal breath sounds. No wheezing, rhonchi or rales.  Musculoskeletal:     Cervical back: Neck supple.  Skin:    General: Skin is warm.     Findings: No rash.  Neurological:     Mental Status: He is alert and oriented to person, place, and time.  Psychiatric:        Behavior: Behavior normal.   Previous notes and tests were reviewed. The plan was reviewed with the patient/family, and all questions/concerned were addressed.  It was my pleasure to see Saba today and participate in his care.  Please feel free to contact me with any questions or concerns.  Sincerely,  Eudelia Hero, DO Allergy  & Immunology  [facility]  [place] office: [phone] [facility] office: [phone]

## 2023-06-14 ENCOUNTER — Other Ambulatory Visit: Payer: Self-pay

## 2023-06-14 ENCOUNTER — Ambulatory Visit: Payer: 59 | Admitting: Allergy

## 2023-06-14 ENCOUNTER — Telehealth: Payer: Self-pay | Admitting: Allergy

## 2023-06-14 ENCOUNTER — Encounter: Payer: Self-pay | Admitting: Allergy

## 2023-06-14 VITALS — BP 138/86 | HR 62 | Temp 98.0°F | Resp 18 | Ht 73.23 in | Wt 215.8 lb

## 2023-06-14 DIAGNOSIS — J301 Allergic rhinitis due to pollen: Secondary | ICD-10-CM

## 2023-06-14 DIAGNOSIS — L821 Other seborrheic keratosis: Secondary | ICD-10-CM | POA: Insufficient documentation

## 2023-06-14 DIAGNOSIS — J3081 Allergic rhinitis due to animal (cat) (dog) hair and dander: Secondary | ICD-10-CM

## 2023-06-14 DIAGNOSIS — F988 Other specified behavioral and emotional disorders with onset usually occurring in childhood and adolescence: Secondary | ICD-10-CM

## 2023-06-14 DIAGNOSIS — D225 Melanocytic nevi of trunk: Secondary | ICD-10-CM | POA: Insufficient documentation

## 2023-06-14 DIAGNOSIS — J3089 Other allergic rhinitis: Secondary | ICD-10-CM | POA: Diagnosis not present

## 2023-06-14 MED ORDER — MONTELUKAST SODIUM 10 MG PO TABS: 10.0000 mg | ORAL_TABLET | Freq: Every day | ORAL | 3 refills | Status: DC

## 2023-06-14 MED ORDER — XHANCE 93 MCG/ACT NA EXHU
INHALANT_SUSPENSION | NASAL | 5 refills | Status: DC
Start: 2023-06-14 — End: 2023-10-13

## 2023-06-14 NOTE — Telephone Encounter (Signed)
 Refer to [facility] ENT for nasal congestion.

## 2023-06-14 NOTE — Patient Instructions (Addendum)
 Environmental allergies 2024 skin testing positive to grass, weed, ragweed, dust mites, cat, dog. Continue environmental control measures as below. Use over the counter antihistamines such as Zyrtec (cetirizine), Claritin (loratadine), Allegra (fexofenadine), or Xyzal (levocetirizine) daily as needed. May take twice a day during allergy  flares. May switch antihistamines every few months. Start Xhance (fluticasone ) nasal spray 1-2 sprays per nostril twice a day as needed for nasal congestion.  This replaces your other nasal sprays.  Sample given and demonstrated proper use. If this is not covered let us  know and go back to your other nasal spray. This will be mailed to you from [facility] pharmacy [phone]). Nasal saline spray (i.e., Simply Saline) or nasal saline lavage (i.e., NeilMed) is recommended as needed and prior to medicated nasal sprays. Continue Singulair  (montelukast ) 10mg  daily at night. Consider allergy  injections for long term control if above medications do not help the symptoms. 2 injections.  Refer to ENT for chronic nasal congestion - to rule out any anatomical issues. Come see me after you've seen ENT.   Return in about 4 months (around 10/14/2023). Or sooner if needed.   Reducing Pollen Exposure Pollen seasons: trees (spring), grass (summer) and ragweed/weeds (fall). Keep windows closed in your home and car to lower pollen exposure.  Install air conditioning in the bedroom and throughout the house if possible.  Avoid going out in dry windy days - especially early morning. Pollen counts are highest between 5 - 10 AM and on dry, hot and windy days.  Save outside activities for late afternoon or after a heavy rain, when pollen levels are lower.  Avoid mowing of grass if you have grass pollen allergy . Be aware that pollen can also be transported indoors on people and pets.  Dry your clothes in an automatic dryer rather than hanging them outside where they might collect pollen.   Rinse hair and eyes before bedtime.  Control of House Dust Mite Allergen Dust mite allergens are a common trigger of allergy  and asthma symptoms. While they can be found throughout the house, these microscopic creatures thrive in warm, humid environments such as bedding, upholstered furniture and carpeting. Because so much time is spent in the bedroom, it is essential to reduce mite levels there.  Encase pillows, mattresses, and box springs in special allergen-proof fabric covers or airtight, zippered plastic covers.  Bedding should be washed weekly in hot water (130 F) and dried in a hot dryer. Allergen-proof covers are available for comforters and pillows that can't be regularly washed.  Wash the allergy -proof covers every few months. Minimize clutter in the bedroom. Keep pets out of the bedroom.  Keep humidity less than 50% by using a dehumidifier or air conditioning. You can buy a humidity measuring device called a hygrometer to monitor this.  If possible, replace carpets with hardwood, linoleum, or washable area rugs. If that's not possible, vacuum frequently with a vacuum that has a HEPA filter. Remove all upholstered furniture and non-washable window drapes from the bedroom. Remove all non-washable stuffed toys from the bedroom.  Wash stuffed toys weekly. Pet Allergen Avoidance: Contrary to popular opinion, there are no "hypoallergenic" breeds of dogs or cats. That is because people are not allergic to an animal's hair, but to an allergen found in the animal's saliva, dander (dead skin flakes) or urine. Pet allergy  symptoms typically occur within minutes. For some people, symptoms can build up and become most severe 8 to 12 hours after contact with the animal. People with severe allergies can  experience reactions in public places if dander has been transported on the pet owners' clothing. Keeping an animal outdoors is only a partial solution, since homes with pets in the yard still have  higher concentrations of animal allergens. Before getting a pet, ask your allergist to determine if you are allergic to animals. If your pet is already considered part of your family, try to minimize contact and keep the pet out of the bedroom and other rooms where you spend a great deal of time. As with dust mites, vacuum carpets often or replace carpet with a hardwood floor, tile or linoleum. High-efficiency particulate air (HEPA) cleaners can reduce allergen levels over time. While dander and saliva are the source of cat and dog allergens, urine is the source of allergens from rabbits, hamsters, mice and [place] pigs; so ask a non-allergic family member to clean the animal's cage. If you have a pet allergy , talk to your allergist about the potential for allergy  immunotherapy (allergy  shots). This strategy can often provide long-term relief.

## 2023-06-28 ENCOUNTER — Encounter (INDEPENDENT_AMBULATORY_CARE_PROVIDER_SITE_OTHER): Payer: Self-pay

## 2023-06-28 MED ORDER — METHYLPHENIDATE HCL 5 MG PO TABS: 5.0000 mg | ORAL_TABLET | Freq: Two times a day (BID) | ORAL | 0 refills | Status: DC

## 2023-06-28 NOTE — Telephone Encounter (Unsigned)
 Copied from CRM [id number]. Topic: Clinical - Medication Refill >> Jun 28, 2023  9:46 AM Deaijah H wrote: Medication: methylphenidate  (RITALIN ) 5 MG tablet  Has the patient contacted their pharmacy? Yes (Agent: If no, request that the patient contact the pharmacy for the refill. If patient does not wish to contact the pharmacy document the reason why and proceed with request.) (Agent: If yes, when and what did the pharmacy advise?) Reach out to provider  This is the patient's preferred pharmacy:  [id number] - [place], [place] - [address] [place] [address] Phone: [phone] Fax: [phone]  Is this the correct pharmacy for this prescription? Yes If no, delete pharmacy and type the correct one.   Has the prescription been filled recently? Yes  Is the patient out of the medication? Yes  Has the patient been seen for an appointment in the last year OR does the patient have an upcoming appointment? Yes  Can we respond through MyChart? Yes  Agent: Please be advised that Rx refills may take up to 3 business days. We ask that you follow-up with your pharmacy.

## 2023-06-28 NOTE — Addendum Note (Signed)
 Addended by: Bernetta Brilliant on: 06/28/2023 10:12 AM   Modules accepted: Orders

## 2023-06-28 NOTE — Telephone Encounter (Signed)
 Requesting: Ritalin  5 mg Contract: N/A UDS: N/A Last Visit: 12/16/2022 Next Visit: 07/05/2023 Last Refill: 05/27/2023  Please Advise

## 2023-07-03 NOTE — Assessment & Plan Note (Signed)
 Stable on low dose Ritalin 

## 2023-07-03 NOTE — Assessment & Plan Note (Signed)
Maintain heart healthy diet such as PURE, MIND or DASH diet, increase exercise, avoid trans fats, simple carbohydrates and processed foods, consider fish or flaxseed oil cap daily.

## 2023-07-03 NOTE — Assessment & Plan Note (Signed)
 hgba1c acceptable, labs done thru wellness plan at work. minimize simple carbs. Increase exercise as tolerated.

## 2023-07-05 ENCOUNTER — Telehealth: Admitting: Family Medicine

## 2023-07-05 DIAGNOSIS — R739 Hyperglycemia, unspecified: Secondary | ICD-10-CM

## 2023-07-05 DIAGNOSIS — E782 Mixed hyperlipidemia: Secondary | ICD-10-CM

## 2023-07-05 DIAGNOSIS — F988 Other specified behavioral and emotional disorders with onset usually occurring in childhood and adolescence: Secondary | ICD-10-CM | POA: Diagnosis not present

## 2023-07-05 NOTE — Progress Notes (Signed)
 MyChart Video Visit    Virtual Visit via Video Note   This patient is at least at moderate risk for complications without adequate follow up. This format is felt to be most appropriate for this patient at this time. Physical exam was limited by quality of the video and audio technology used for the visit. Porsha, CMA was able to get the patient set up on a video visit.  Patient location: home Patient and provider in visit Provider location: Office  I discussed the limitations of evaluation and management by telemedicine and the availability of in person appointments. The patient expressed understanding and agreed to proceed.  Visit Date: 07/05/2023  Today's healthcare provider: Harlene Horton, MD     Subjective:    Patient ID: Austin Macdonald, male    DOB: 04-Aug-1955, 68 y.o.   MRN: [id number]  Chief Complaint  Patient presents with   Medical Management of Chronic Issues    Patient presents today for a 6 month follow-up.   Quality Metric Gaps    Pneumococcal     HPI Discussed the use of AI scribe software for clinical note transcription with the patient, who gave verbal consent to proceed.  History of Present Illness Austin Macdonald is a 68 year old male who presents for follow-up on sinus issues and potential sleep apnea.  He has ongoing sinus issues, with a persistent lack of clear sinuses. He has not yet received a referral to an ENT for possible polyps, which was suggested three weeks ago. He plans to follow up with his provider to expedite the referral process.  He experiences snoring and is considering a sleep study to evaluate for sleep apnea. He uses a Fitbit to monitor his sleep, which shows low variation in sleep patterns. He wakes up early, around 4 AM, and feels rested, often starting his day at that time. He is contemplating a home sleep study after the summer.  He discusses his physical activity, stating he plays golf regularly, which he enjoys and  finds invigorating. He experiences stiffness and pain after prolonged sitting or upon waking, which improves with movement.  Recent blood work shows improvements in his A1c, glucose, and cholesterol levels. His A1c was slightly lower than before, glucose was at 90, and total cholesterol was 177. LDL was slightly up, but the ratio was better. He attributes these improvements to healthy eating and regular physical activity.  He is currently managing his medications with monthly refills and has experienced no major illnesses, ER visits, or new symptoms such as bowel or breathing issues.    Past Medical History:  Diagnosis Date   ADD (attention deficit disorder) 12/30/2015   Benign paroxysmal positional vertigo 12/26/2014   Chicken pox as a child   Colon polyps    Elevated BP 03/03/2015   Hay fever    seasonal, spring, fall   Hyperglycemia 03/03/2015   Measles as a child   Mumps as a child   Preventative health care 07/25/2012   Tinnitus 12/29/2014    Past Surgical History:  Procedure Laterality Date   dental implants     knee arthroscopic repair  01/11/2002   right    Family History  Problem Relation Age of Onset   Hypertension Mother    Heart disease Mother        CHF   Dementia Father    Other Paternal Grandfather        black lung   Thyroid  cancer Daughter  Allergic rhinitis Neg Hx    Angioedema Neg Hx    Atopy Neg Hx    Asthma Neg Hx    Immunodeficiency Neg Hx    Eczema Neg Hx    Urticaria Neg Hx     Social History   Socioeconomic History   Marital status: Married    Spouse name: Not on file   Number of children: Not on file   Years of education: Not on file   Highest education level: Bachelor's degree (e.g., BA, AB, BS)  Occupational History   Not on file  Tobacco Use   Smoking status: Former    Types: Cigarettes    Passive exposure: Past   Smokeless tobacco: Never  Vaping Use   Vaping status: Never Used  Substance and Sexual Activity   Alcohol use:  Yes    Comment: 10-12 drinks weekly   Drug use: No   Sexual activity: Yes    Comment: lives with wife, travels for work Editor, commissioning, no major dietary restrictions  Other Topics Concern   Not on file  Social History Narrative   Not on file   Social Drivers of Health   Financial Resource Strain: Low Risk  (07/05/2023)   Overall Financial Resource Strain (CARDIA)    Difficulty of Paying Living Expenses: Not hard at all  Food Insecurity: No Food Insecurity (07/05/2023)   Hunger Vital Sign    Worried About Running Out of Food in the Last Year: Never true    Ran Out of Food in the Last Year: Never true  Transportation Needs: No Transportation Needs (07/05/2023)   PRAPARE - Administrator, Civil Service (Medical): No    Lack of Transportation (Non-Medical): No  Physical Activity: Sufficiently Active (07/05/2023)   Exercise Vital Sign    Days of Exercise per Week: 5 days    Minutes of Exercise per Session: 120 min  Stress: No Stress Concern Present (07/05/2023)   [employer] of Occupational Health - Occupational Stress Questionnaire    Feeling of Stress: Not at all  Social Connections: Moderately Isolated (07/05/2023)   Social Connection and Isolation Panel    Frequency of Communication with Friends and Family: More than three times a week    Frequency of Social Gatherings with Friends and Family: Once a week    Attends Religious Services: Patient declined    Database administrator or Organizations: No    Attends Engineer, structural: Not on file    Marital Status: Married  Catering manager Violence: Not on file    Outpatient Medications Prior to Visit  Medication Sig Dispense Refill   Fluticasone  Propionate (XHANCE ) 93 MCG/ACT EXHU 1-2 sprays per nostril twice a day. 16 mL 5   ibuprofen  (ADVIL ) 200 MG tablet Take 1 tablet (200 mg total) by mouth every 6 (six) hours as needed for pain. 30 tablet 0   levocetirizine (XYZAL) 5 MG tablet Take 5 mg by mouth every  evening.     methylphenidate  (RITALIN ) 5 MG tablet Take 1 tablet (5 mg total) by mouth 2 (two) times daily with breakfast and lunch. 60 tablet 0   montelukast  (SINGULAIR ) 10 MG tablet Take 1 tablet (10 mg total) by mouth at bedtime. 90 tablet 3   No facility-administered medications prior to visit.    No Known Allergies  Review of Systems  Constitutional:  Negative for fever and malaise/fatigue.  HENT:  Positive for congestion.   Eyes:  Negative for blurred vision.  Respiratory:  Negative for shortness of breath.   Cardiovascular:  Negative for chest pain, palpitations and leg swelling.  Gastrointestinal:  Negative for abdominal pain, blood in stool and nausea.  Genitourinary:  Negative for dysuria and frequency.  Musculoskeletal:  Positive for joint pain and myalgias. Negative for falls.  Skin:  Negative for rash.  Neurological:  Negative for dizziness, loss of consciousness and headaches.  Endo/Heme/Allergies:  Negative for environmental allergies.  Psychiatric/Behavioral:  Negative for depression. The patient is not nervous/anxious.       Objective:    Physical Exam Constitutional:      General: He is not in acute distress.    Appearance: Normal appearance. He is not ill-appearing or toxic-appearing.  HENT:     Head: Normocephalic and atraumatic.     Right Ear: External ear normal.     Left Ear: External ear normal.     Nose: Nose normal.   Eyes:     General:        Right eye: No discharge.        Left eye: No discharge.   Pulmonary:     Effort: Pulmonary effort is normal.   Skin:    Findings: No rash.   Neurological:     Mental Status: He is alert and oriented to person, place, and time.   Psychiatric:        Behavior: Behavior normal.   There were no vitals taken for this visit. Wt Readings from Last 3 Encounters:  06/14/23 215 lb 12.8 oz (97.9 kg)  12/21/22 227 lb (103 kg)  12/17/22 215 lb (97.5 kg)       Assessment & Plan:   Hyperglycemia Assessment & Plan: hgba1c acceptable, labs done thru wellness plan at work. minimize simple carbs. Increase exercise as tolerated.   Hyperlipidemia, mixed Assessment & Plan: Maintain heart healthy diet such as PURE, MIND or DASH diet, increase exercise, avoid trans fats, simple carbohydrates and processed foods, consider fish or flaxseed oil cap daily.     Attention deficit disorder, unspecified type Assessment & Plan: Stable on low dose Ritalin       Assessment and Plan Assessment & Plan Chronic Sinusitis Persistent sinus congestion, awaiting ENT referral for possible polyps. - Follow up with provider regarding ENT referral. - Arrange ENT referral if no response from current provider.  Allergic Rhinitis Symptoms due to cat and dog allergies, managed with regular allergy  immunotherapy. - Continue regular allergy  immunotherapy.  Obstructive Sleep Apnea (suspected) Reports snoring and low sleep pattern variation. Discussed that sleep apnea can occur in fit individuals without significant health issues. - Consider home sleep study after summer.  Osteoarthritis Age-related symptoms, including stiffness and pain alleviated by movement. Regular golf activity aids symptom management. - Encourage continued physical activity.  Hyperlipidemia, mixed Recent labs show improved cholesterol levels, attributed to healthy lifestyle changes. - Send recent lab results to provider. - Discuss lab results during December appointment.  Hyperglycemia Recent labs indicate improved glucose levels, with A1c slightly lower, remaining in the high side of the good range. - Send recent lab results to provider. - Discuss lab results during December appointment.  General Health Maintenance Maintains an active lifestyle and effective stress management. - Encourage continued active lifestyle and stress management.     I discussed the assessment and treatment plan with the patient.  The patient was provided an opportunity to ask questions and all were answered. The patient agreed with the plan and demonstrated an understanding of the instructions.   The patient  was advised to call back or seek an in-person evaluation if the symptoms worsen or if the condition fails to improve as anticipated.  Harlene Horton, MD [facility], [place] [phone] (phone) [phone] (fax)  [facility]

## 2023-08-01 ENCOUNTER — Other Ambulatory Visit: Payer: Self-pay | Admitting: Family Medicine

## 2023-08-01 DIAGNOSIS — F988 Other specified behavioral and emotional disorders with onset usually occurring in childhood and adolescence: Secondary | ICD-10-CM

## 2023-08-01 MED ORDER — METHYLPHENIDATE HCL 5 MG PO TABS: 5.0000 mg | ORAL_TABLET | Freq: Two times a day (BID) | ORAL | 0 refills | Status: DC

## 2023-08-01 NOTE — Telephone Encounter (Unsigned)
 Copied from CRM [id number]. Topic: Clinical - Medication Refill >> Aug 01, 2023  8:43 AM Henretta I wrote: Medication:  methylphenidate  (RITALIN ) 5 MG tablet   Has the patient contacted their pharmacy? No (Agent: If no, request that the patient contact the pharmacy for the refill. If patient does not wish to contact the pharmacy document the reason why and proceed with request.) (Agent: If yes, when and what did the pharmacy advise?)  This is the patient's preferred pharmacy:   [id number] [place], [place] - [address] [place] [address] Phone: [phone] Fax: [phone]   Is this the correct pharmacy for this prescription? Yes If no, delete pharmacy and type the correct one.   Has the prescription been filled recently? No  Is the patient out of the medication? Yes  Has the patient been seen for an appointment in the last year OR does the patient have an upcoming appointment? Yes  Can we respond through MyChart? Yes  Agent: Please be advised that Rx refills may take up to 3 business days. We ask that you follow-up with your pharmacy.

## 2023-08-01 NOTE — Telephone Encounter (Signed)
 Requesting: methylphenidate  5mg  Contract: 09/15/2018 UDS: 05/28/21 Last Visit: 07/05/23 Next Visit: 12/22/23 Last Refill: 06/28/23 #60 and 0RF   Please Advise

## 2023-09-05 ENCOUNTER — Telehealth: Payer: Self-pay | Admitting: Family Medicine

## 2023-09-05 NOTE — Telephone Encounter (Unsigned)
 Copied from CRM #[id number]. Topic: Clinical - Medication Refill >> Sep 05, 2023  3:42 PM Gennette ORN wrote: Medication: methylphenidate  (RITALIN ) 5 MG tablet  Has the patient contacted their pharmacy? Yes (Agent: If no, request that the patient contact the pharmacy for the refill. If patient does not wish to contact the pharmacy document the reason why and proceed with request.) (Agent: If yes, when and what did the pharmacy advise?)  This is the patient's preferred pharmacy:   [id number] [place], [place] - [address] [place] [address] Phone: [phone] Fax: [phone]  Is this the correct pharmacy for this prescription? Yes If no, delete pharmacy and type the correct one.   Has the prescription been filled recently? Yes  Is the patient out of the medication? Yes  Has the patient been seen for an appointment in the last year OR does the patient have an upcoming appointment? Yes  Can we respond through MyChart? Yes  Agent: Please be advised that Rx refills may take up to 3 business days. We ask that you follow-up with your pharmacy.

## 2023-09-13 NOTE — Telephone Encounter (Signed)
 Patient called and requested Ritalin  5 MG to be prescribed. He was advised medication may not be approved due to him stating he is out of state.

## 2023-09-13 NOTE — Telephone Encounter (Unsigned)
 Copied from CRM #[id number]. Topic: Clinical - Prescription Issue >> Sep 13, 2023  3:23 PM Austin Macdonald wrote: Reason for CRM: Pt called to follow up on methylphenidate  prescription refill. Informed pt that a previous note said prescription can't be sent across state lines. Pt said that it was done in July. Pls contact patient. He said it's okay to send message in MyChart.

## 2023-09-16 ENCOUNTER — Other Ambulatory Visit: Payer: Self-pay | Admitting: Family Medicine

## 2023-09-16 DIAGNOSIS — F988 Other specified behavioral and emotional disorders with onset usually occurring in childhood and adolescence: Secondary | ICD-10-CM

## 2023-09-16 MED ORDER — METHYLPHENIDATE HCL 5 MG PO TABS: 5.0000 mg | ORAL_TABLET | Freq: Two times a day (BID) | ORAL | 0 refills | Status: DC

## 2023-09-30 ENCOUNTER — Ambulatory Visit (INDEPENDENT_AMBULATORY_CARE_PROVIDER_SITE_OTHER): Admitting: Otolaryngology

## 2023-09-30 VITALS — BP 157/87 | Temp 98.7°F | Ht 74.0 in | Wt 215.0 lb

## 2023-09-30 DIAGNOSIS — Z87891 Personal history of nicotine dependence: Secondary | ICD-10-CM

## 2023-09-30 DIAGNOSIS — R0981 Nasal congestion: Secondary | ICD-10-CM | POA: Diagnosis not present

## 2023-09-30 DIAGNOSIS — J343 Hypertrophy of nasal turbinates: Secondary | ICD-10-CM

## 2023-09-30 DIAGNOSIS — J3489 Other specified disorders of nose and nasal sinuses: Secondary | ICD-10-CM

## 2023-09-30 DIAGNOSIS — J3089 Other allergic rhinitis: Secondary | ICD-10-CM

## 2023-09-30 DIAGNOSIS — J342 Deviated nasal septum: Secondary | ICD-10-CM

## 2023-09-30 DIAGNOSIS — R0982 Postnasal drip: Secondary | ICD-10-CM

## 2023-09-30 NOTE — Progress Notes (Signed)
 ENT CONSULT:  Reason for Consult: chronic nasal congestion    HPI: Discussed the use of AI scribe software for clinical note transcription with the patient, who gave verbal consent to proceed.  History of Present Illness Austin Macdonald is a 68 year old male who presents with chronic nasal congestion. He was referred by Dr. Luke Allergy , for evaluation of his chronic nasal congestion.  He has been experiencing chronic nasal congestion, which he manages with a saline rinses, over-the-counter allergy  medications and Xhance . The saline flush provides the most relief, followed by the nasal spray.  He has a history of nasal trauma from childhood when a bar fell across his nose and reports having a flat nose bridge. He is uncertain if this contributes to his current symptoms.  No difficulty breathing through both sides of his nose, but the right nostril is consistently more congested, especially depending on which side he sleeps on. He travels frequently.  He previously underwent allergy  shots between 1970 and 1972 but is not currently receiving them.   Records Reviewed:  Asthma and Allergy  Office Visit 06/14/23 I had the pleasure of seeing Austin Macdonald for a follow up visit at the [facility] on 06/14/2023. He is a 68 y.o. male, who is being followed for allergic rhinitis. His previous allergy  office visit was on 12/30/2022 with Dr. Luke. Today is a regular follow up visit.   Discussed the use of AI scribe software for clinical note transcription with the patient, who gave verbal consent to proceed.    He experiences persistent nasal congestion without rhinorrhea, questioning whether his sinuses are ever empty. He uses a nasal irrigation and a combination nasal spray containing azelastine  and fluticasone , but finds only partial relief. He uses the nasal spray once or twice daily, depending on his memory and convenience.   He takes Singulair  (montelukast ) at night -  noting that he is 'eighty to ninety percent compliant.' He is also rotating between Allegra and levocetirizine, starting the latter tomorrow. He finds Allegra more effective than Zyrtec. He experiences congestion rather than drainage, with a sensation of fullness in his sinuses.   He has not seen an ENT recently but recalls a childhood incident where a high jump bar fell on his nose, which he wonders might have caused structural issues.   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 His work schedule is variable, affecting his ability to maintain a consistent routine for allergy  shots.     Past Medical History:  Diagnosis Date   ADD (attention deficit disorder) 12/30/2015   Benign paroxysmal positional vertigo 12/26/2014   Chicken pox as a child   Colon polyps    Elevated BP 03/03/2015   Hay fever    seasonal, spring, fall   Hyperglycemia 03/03/2015   Measles as a child   Mumps as a child   Preventative health care 07/25/2012   Tinnitus 12/29/2014    Past Surgical History:  Procedure Laterality Date   dental implants     knee arthroscopic repair  01/11/2002   right    Family History  Problem Relation Age of Onset   Hypertension Mother    Heart disease Mother        CHF   Dementia Father    Other Paternal Grandfather        black lung   Thyroid  cancer Daughter    Allergic rhinitis Neg Hx  Angioedema Neg Hx    Atopy Neg Hx    Asthma Neg Hx    Immunodeficiency Neg Hx    Eczema Neg Hx    Urticaria Neg Hx     Social History:  reports that he has quit smoking. His smoking use included cigarettes. He has been exposed to tobacco smoke. He has never used smokeless tobacco. He reports current alcohol use. He reports that he does not use drugs.  Allergies: No Known Allergies  Medications: I have reviewed the patient's current medications.  The PMH, PSH, Medications, Allergies, and SH were reviewed and  updated.  ROS: Constitutional: Negative for fever, weight loss and weight gain. Cardiovascular: Negative for chest pain and dyspnea on exertion. Respiratory: Is not experiencing shortness of breath at rest. Gastrointestinal: Negative for nausea and vomiting. Neurological: Negative for headaches. Psychiatric: The patient is not nervous/anxious  Blood pressure (!) 149/84, temperature 98.7 F (37.1 C), height 6' 2 (1.88 m), weight 215 lb (97.5 kg). Body mass index is 27.6 kg/m.  PHYSICAL EXAM:  Exam: General: Well-developed, well-nourished Respiratory Respiratory effort: Equal inspiration and expiration without stridor Cardiovascular Peripheral Vascular: Warm extremities with equal color/perfusion Eyes: No nystagmus with equal extraocular motion bilaterally Neuro/Psych/Balance: Patient oriented to person, place, and time; Appropriate mood and affect; Gait is intact with no imbalance; Cranial nerves I-XII are intact Head and Face Inspection: Normocephalic and atraumatic without mass or lesion Palpation: Facial skeleton intact without bony stepoffs Salivary Glands: No mass or tenderness Facial Strength: Facial motility symmetric and full bilaterally ENT Pinna: External ear intact and fully developed External canal: Canal is patent with intact skin Tympanic Membrane: Clear and mobile External Nose: No scar or anatomic deformity Internal Nose: Septum is significantly deviated to the left. Right side is patent. No polyp, or purulence. Mucosal edema and erythema present.  Bilateral inferior turbinate hypertrophy.  Lips, Teeth, and gums: Mucosa and teeth intact and viable TMJ: No pain to palpation with full mobility Oral cavity/oropharynx: No erythema or exudate, no lesions present Nasopharynx: No mass or lesion with intact mucosa Hypopharynx: Intact mucosa without pooling of secretions Larynx Glottic: Full true vocal cord mobility without lesion or mass Supraglottic: Normal  appearing epiglottis and AE folds Interarytenoid Space: Moderate pachydermia&edema Subglottic Space: Patent without lesion or edema Neck Neck and Trachea: Midline trachea without mass or lesion Thyroid : No mass or nodularity Lymphatics: No lymphadenopathy  Procedure:   PROCEDURE NOTE: nasal endoscopy  Preoperative diagnosis: chronic sinusitis symptoms  Postoperative diagnosis: same  Procedure: Diagnostic nasal endoscopy (68768)  Surgeon: Elena Larry, M.D.  Anesthesia: Topical lidocaine and Afrin  H&P REVIEW: The patient's history and physical were reviewed today prior to procedure. All medications were reviewed and updated as well. Complications: None Condition is stable throughout exam Indications and consent: The patient presents with symptoms of chronic sinusitis not responding to previous therapies. All the risks, benefits, and potential complications were reviewed with the patient preoperatively and informed consent was obtained. The time out was completed with confirmation of the correct procedure.   Procedure: The patient was seated upright in the clinic. Topical lidocaine and Afrin were applied to the nasal cavity. After adequate anesthesia had occurred, the rigid nasal endoscope was passed into the nasal cavity. The nasal mucosa, turbinates, septum, and sinus drainage pathways were visualized bilaterally. This revealed no purulence or significant secretions that might be cultured. There were no polyps or sites of significant inflammation. The mucosa was intact and there was no crusting present. The scope was then  slowly withdrawn and the patient tolerated the procedure well. There were no complications or blood loss.   Assessment/Plan: Encounter Diagnoses  Name Primary?   Chronic nasal congestion Yes   Nasal obstruction    Nasal septal deviation    Hypertrophy of both inferior nasal turbinates    Environmental and seasonal allergies    Nasal congestion     Post-nasal drip     Assessment and Plan Assessment & Plan Chronic nasal congestion Chronic nasal congestion managed with saline rinses, Xyzal, and Xhance  nasal spray. Right nostril more affected despite left anatomical narrowing of the left nasal passage based on pattern of septal deviation seen during nasal endoscopy today. No nasal polyps and no purulence today. - Continue current management with Xhance , Xyzal and nasal rinses, and see Dr. Luke for allergies. - Consider septoplasty if desired for sx not responsive to medical management   Deviated nasal septum Septal deviation with left side narrowing. No significant correlation with reported congestion on the right side - Consider septoplasty if desired, though it may not significantly relieve congestion.   Thank you for allowing me to participate in the care of this patient. Please do not hesitate to contact me with any questions or concerns.   Elena Larry, MD Otolaryngology [facility] Phone: [phone] Fax: [phone]    09/30/2023, 11:07 AM

## 2023-10-13 ENCOUNTER — Other Ambulatory Visit: Payer: Self-pay

## 2023-10-13 ENCOUNTER — Encounter: Payer: Self-pay | Admitting: Allergy

## 2023-10-13 ENCOUNTER — Ambulatory Visit: Admitting: Allergy

## 2023-10-13 VITALS — BP 122/82 | HR 62 | Temp 98.1°F | Resp 18 | Ht 74.0 in | Wt 217.0 lb

## 2023-10-13 DIAGNOSIS — J3089 Other allergic rhinitis: Secondary | ICD-10-CM

## 2023-10-13 DIAGNOSIS — J3081 Allergic rhinitis due to animal (cat) (dog) hair and dander: Secondary | ICD-10-CM | POA: Diagnosis not present

## 2023-10-13 DIAGNOSIS — J301 Allergic rhinitis due to pollen: Secondary | ICD-10-CM

## 2023-10-13 MED ORDER — XHANCE 93 MCG/ACT NA EXHU: INHALANT_SUSPENSION | NASAL | 5 refills | Status: DC

## 2023-10-13 NOTE — Progress Notes (Signed)
 Follow Up Note  RE: Austin Macdonald MRN: [id number] DOB: 03/21/1955 Date of Office Visit: 10/13/2023  Referring provider: Domenica Harlene LABOR, MD Primary care provider: Domenica Harlene LABOR, MD  Chief Complaint: Follow-up (Patient presents to the office for a follow up from ENT.)  History of Present Illness: I had the pleasure of seeing Austin Macdonald for a follow up visit at the [facility] on 10/13/2023. He is a 68 y.o. male, who is being followed for allergic rhinitis. His previous allergy  office visit was on 06/14/2023 with Dr. Luke. Today is a regular follow up visit.  Discussed the use of AI scribe software for clinical note transcription with the patient, who gave verbal consent to proceed.    He has experienced persistent nasal congestion since his last visit in June, with no significant change in symptoms. He notes some good days with minimal congestion, which he finds surprising given the time of year and his outdoor activities.  He has resumed taking Claritin and believes it may be helping. He uses Xhance  nasal spray, administering two sprays per nostril, usually once a day, sometimes twice. He finds this delivery method preferable and ranks it as the most effective treatment he has tried, although he still experiences congestion.  He visited an ENT specialist who told him he had a slightly deviated septum and did not mention finding any polyps or swollen turbinates.  He is currently taking montelukast  (Singulair ) but is uncertain of its effectiveness, noting that he has not been congestion-free for a couple of years. He mentions that he sometimes feels congestion-free on longer flights, speculating about the filtered air or pressure as possible factors.  He lives ten minutes away from the office and is considering the logistics of starting allergy  shots, including the possibility of rush therapy to expedite the buildup phase.     09/30/2023 ENT visit: Chronic nasal  congestion Chronic nasal congestion managed with saline rinses, Xyzal, and Xhance  nasal spray. Right nostril more affected despite left anatomical narrowing of the left nasal passage based on pattern of septal deviation seen during nasal endoscopy today. No nasal polyps and no purulence today. - Continue current management with Xhance , Xyzal and nasal rinses, and see Dr. Luke for allergies. - Consider septoplasty if desired for sx not responsive to medical management    Deviated nasal septum Septal deviation with left side narrowing. No significant correlation with reported congestion on the right side - Consider septoplasty if desired, though it may not significantly relieve congestion.  Assessment and Plan: Austin Macdonald is a 68 y.o. male with: Seasonal allergic rhinitis due to pollen Allergic rhinitis due to dust mite Allergic rhinitis due to animal dander Past history - Previous allergy  shots for dust mites and ragweed were beneficial. 2024 skin testing positive to grass, weed, ragweed, dust mites, cat, dog. Interim history - saw ENT and has deviated septum. Xhance  helps but still has some symptoms. Interested in both sinus surgery and AIT.  Continue environmental control measures as below. Use over the counter antihistamines such as Zyrtec (cetirizine), Claritin (loratadine), Allegra (fexofenadine), or Xyzal (levocetirizine) daily as needed. May take twice a day during allergy  flares. May switch antihistamines every few months. Continue Xhance  (fluticasone ) nasal spray 1-2 sprays per nostril twice a day as needed for nasal congestion.  This replaces your other nasal sprays.  Nasal saline spray (i.e., Simply Saline) or nasal saline lavage (i.e., NeilMed) is recommended as needed and prior to medicated nasal sprays. May take  Singulair  (montelukast ) 10mg  daily at night as needed.  Recommend allergy  injections. 2 injections.  Let us  know when ready to start.  Rush vs traditional build up.  Had a  detailed discussion with patient/family that clinical history is suggestive of allergic rhinitis, and may benefit from allergy  immunotherapy (AIT). Discussed in detail regarding the dosing, schedule, side effects (mild to moderate local allergic reaction and rarely systemic allergic reactions including anaphylaxis), and benefits (significant improvement in nasal symptoms, seasonal flares of asthma) of immunotherapy with the patient. There is significant time commitment involved with allergy  shots, which includes weekly immunotherapy injections for first 9-12 months and then biweekly to monthly injections for 3-5 years. Consent was signed. Please follow up with ENT regarding the sinus surgery.  Return in about 6 months (around 04/12/2024).  Meds ordered this encounter  Medications   Fluticasone  Propionate (XHANCE ) 93 MCG/ACT EXHU    Sig: 1-2 sprays per nostril twice a day.    Dispense:  16 mL    Refill:  5   Lab Orders  No laboratory test(s) ordered today    Diagnostics: None.   Medication List:  Current Outpatient Medications  Medication Sig Dispense Refill   ibuprofen  (ADVIL ) 200 MG tablet Take 1 tablet (200 mg total) by mouth every 6 (six) hours as needed for pain. 30 tablet 0   methylphenidate  (RITALIN ) 5 MG tablet Take 1 tablet (5 mg total) by mouth 2 (two) times daily with breakfast and lunch. 60 tablet 0   Fluticasone  Propionate (XHANCE ) 93 MCG/ACT EXHU 1-2 sprays per nostril twice a day. 16 mL 5   levocetirizine (XYZAL) 5 MG tablet Take 5 mg by mouth every evening. (Patient not taking: Reported on 09/30/2023)     montelukast  (SINGULAIR ) 10 MG tablet Take 1 tablet (10 mg total) by mouth at bedtime. (Patient not taking: Reported on 09/30/2023) 90 tablet 3   No current facility-administered medications for this visit.   Allergies: No Known Allergies I reviewed his past medical history, social history, family history, and environmental history and no significant changes have been  reported from his previous visit.  Review of Systems  Constitutional:  Negative for appetite change, chills, fever and unexpected weight change.  HENT:  Positive for congestion. Negative for rhinorrhea.   Eyes:  Negative for itching.  Respiratory:  Negative for cough, chest tightness, shortness of breath and wheezing.   Cardiovascular:  Negative for chest pain.  Gastrointestinal:  Negative for abdominal pain.  Genitourinary:  Negative for difficulty urinating.  Skin:  Negative for rash.  Allergic/Immunologic: Positive for environmental allergies.  Neurological:  Negative for headaches.    Objective: BP 122/82 (BP Location: Left Arm, Patient Position: Sitting, Cuff Size: Large)   Pulse 62   Temp 98.1 F (36.7 C) (Temporal)   Resp 18   Ht 6' 2 (1.88 m)   Wt 217 lb (98.4 kg)   SpO2 98%   BMI 27.86 kg/m  Body mass index is 27.86 kg/m. Physical Exam Vitals and nursing note reviewed.  Constitutional:      Appearance: Normal appearance. He is well-developed.  HENT:     Head: Normocephalic and atraumatic.     Right Ear: Tympanic membrane and external ear normal.     Left Ear: Tympanic membrane and external ear normal.     Nose: Nose normal.     Mouth/Throat:     Mouth: Mucous membranes are moist.     Pharynx: Oropharynx is clear.  Eyes:     Conjunctiva/sclera: Conjunctivae  normal.  Cardiovascular:     Rate and Rhythm: Normal rate and regular rhythm.     Heart sounds: Normal heart sounds. No murmur heard.    No friction rub. No gallop.  Pulmonary:     Effort: Pulmonary effort is normal.     Breath sounds: Normal breath sounds. No wheezing, rhonchi or rales.  Musculoskeletal:     Cervical back: Neck supple.  Skin:    General: Skin is warm.     Findings: No rash.  Neurological:     Mental Status: He is alert and oriented to person, place, and time.  Psychiatric:        Behavior: Behavior normal.    Previous notes and tests were reviewed. The plan was reviewed with  the patient/family, and all questions/concerned were addressed.  It was my pleasure to see Austin Macdonald today and participate in his care. Please feel free to contact me with any questions or concerns.  Sincerely,  Orlan Cramp, DO Allergy  & Immunology  [facility]  [place] office: [phone] [facility] office: [phone]

## 2023-10-13 NOTE — Patient Instructions (Addendum)
 Environmental allergies 2024 skin testing positive to grass, weed, ragweed, dust mites, cat, dog. Continue environmental control measures as below. Use over the counter antihistamines such as Zyrtec (cetirizine), Claritin (loratadine), Allegra (fexofenadine), or Xyzal (levocetirizine) daily as needed. May take twice a day during allergy  flares. May switch antihistamines every few months. Continue Xhance  (fluticasone ) nasal spray 1-2 sprays per nostril twice a day as needed for nasal congestion.  This replaces your other nasal sprays.  Nasal saline spray (i.e., Simply Saline) or nasal saline lavage (i.e., NeilMed) is recommended as needed and prior to medicated nasal sprays. May take Singulair  (montelukast ) 10mg  daily at night as needed.  Recommend allergy  injections. 2 injections.  Let us  know when ready to start.  Rush vs traditional build up.  Had a detailed discussion with patient/family that clinical history is suggestive of allergic rhinitis, and may benefit from allergy  immunotherapy (AIT). Discussed in detail regarding the dosing, schedule, side effects (mild to moderate local allergic reaction and rarely systemic allergic reactions including anaphylaxis), and benefits (significant improvement in nasal symptoms, seasonal flares of asthma) of immunotherapy with the patient. There is significant time commitment involved with allergy  shots, which includes weekly immunotherapy injections for first 9-12 months and then biweekly to monthly injections for 3-5 years. Consent was signed.  Please follow up with ENT regarding the sinus surgery.  Return in about 6 months (around 04/12/2024). Or sooner if needed.   Reducing Pollen Exposure Pollen seasons: trees (spring), grass (summer) and ragweed/weeds (fall). Keep windows closed in your home and car to lower pollen exposure.  Install air conditioning in the bedroom and throughout the house if possible.  Avoid going out in dry windy days - especially  early morning. Pollen counts are highest between 5 - 10 AM and on dry, hot and windy days.  Save outside activities for late afternoon or after a heavy rain, when pollen levels are lower.  Avoid mowing of grass if you have grass pollen allergy . Be aware that pollen can also be transported indoors on people and pets.  Dry your clothes in an automatic dryer rather than hanging them outside where they might collect pollen.  Rinse hair and eyes before bedtime.  Control of House Dust Mite Allergen Dust mite allergens are a common trigger of allergy  and asthma symptoms. While they can be found throughout the house, these microscopic creatures thrive in warm, humid environments such as bedding, upholstered furniture and carpeting. Because so much time is spent in the bedroom, it is essential to reduce mite levels there.  Encase pillows, mattresses, and box springs in special allergen-proof fabric covers or airtight, zippered plastic covers.  Bedding should be washed weekly in hot water (130 F) and dried in a hot dryer. Allergen-proof covers are available for comforters and pillows that can't be regularly washed.  Wash the allergy -proof covers every few months. Minimize clutter in the bedroom. Keep pets out of the bedroom.  Keep humidity less than 50% by using a dehumidifier or air conditioning. You can buy a humidity measuring device called a hygrometer to monitor this.  If possible, replace carpets with hardwood, linoleum, or washable area rugs. If that's not possible, vacuum frequently with a vacuum that has a HEPA filter. Remove all upholstered furniture and non-washable window drapes from the bedroom. Remove all non-washable stuffed toys from the bedroom.  Wash stuffed toys weekly. Pet Allergen Avoidance: Contrary to popular opinion, there are no "hypoallergenic" breeds of dogs or cats. That is because people are not allergic  to an animal's hair, but to an allergen found in the animal's saliva,  dander (dead skin flakes) or urine. Pet allergy  symptoms typically occur within minutes. For some people, symptoms can build up and become most severe 8 to 12 hours after contact with the animal. People with severe allergies can experience reactions in public places if dander has been transported on the pet owners' clothing. Keeping an animal outdoors is only a partial solution, since homes with pets in the yard still have higher concentrations of animal allergens. Before getting a pet, ask your allergist to determine if you are allergic to animals. If your pet is already considered part of your family, try to minimize contact and keep the pet out of the bedroom and other rooms where you spend a great deal of time. As with dust mites, vacuum carpets often or replace carpet with a hardwood floor, tile or linoleum. High-efficiency particulate air (HEPA) cleaners can reduce allergen levels over time. While dander and saliva are the source of cat and dog allergens, urine is the source of allergens from rabbits, hamsters, mice and israel pigs; so ask a non-allergic family member to clean the animal's cage. If you have a pet allergy , talk to your allergist about the potential for allergy  immunotherapy (allergy  shots). This strategy can often provide long-term relief.

## 2023-10-14 ENCOUNTER — Other Ambulatory Visit (INDEPENDENT_AMBULATORY_CARE_PROVIDER_SITE_OTHER): Payer: Self-pay | Admitting: Otolaryngology

## 2023-10-14 DIAGNOSIS — J3489 Other specified disorders of nose and nasal sinuses: Secondary | ICD-10-CM

## 2023-10-14 DIAGNOSIS — J343 Hypertrophy of nasal turbinates: Secondary | ICD-10-CM

## 2023-10-14 DIAGNOSIS — R0981 Nasal congestion: Secondary | ICD-10-CM

## 2023-10-14 DIAGNOSIS — J342 Deviated nasal septum: Secondary | ICD-10-CM

## 2023-10-21 ENCOUNTER — Telehealth: Payer: Self-pay | Admitting: Family Medicine

## 2023-10-21 NOTE — Telephone Encounter (Unsigned)
 Copied from CRM [id number]. Topic: Clinical - Medication Refill >> Oct 21, 2023  1:43 PM [place] A wrote: Medication: methylphenidate  (RITALIN ) 5 MG tablet  Has the patient contacted their pharmacy? No (Agent: If no, request that the patient contact the pharmacy for the refill. If patient does not wish to contact the pharmacy document the reason why and proceed with request.) (Agent: If yes, when and what did the pharmacy advise?)  This is the patient's preferred pharmacy:  [id number] - [place], [place] - [address] [place] [address] [place] [address] [place] [address] Phone: [phone] Fax: [phone]   Is this the correct pharmacy for this prescription? Yes If no, delete pharmacy and type the correct one.   Has the prescription been filled recently? No  Is the patient out of the medication? Yes  Has the patient been seen for an appointment in the last year OR does the patient have an upcoming appointment? Yes  Can we respond through MyChart? Yes  Agent: Please be advised that Rx refills may take up to 3 business days. We ask that you follow-up with your pharmacy.

## 2023-10-27 ENCOUNTER — Telehealth: Payer: Self-pay

## 2023-10-27 ENCOUNTER — Telehealth: Payer: Self-pay | Admitting: Family Medicine

## 2023-10-27 MED ORDER — XHANCE 93 MCG/ACT NA EXHU: INHALANT_SUSPENSION | NASAL | 5 refills | Status: AC

## 2023-10-27 NOTE — Telephone Encounter (Unsigned)
 Copied from CRM [id number]. Topic: Clinical - Medication Refill >> Oct 27, 2023  9:57 AM Alfonso HERO wrote: Medication:  methylphenidate  (RITALIN ) 5 MG tablet  Has the patient contacted their pharmacy? Yes (Agent: If no, request that the patient contact the pharmacy for the refill. If patient does not wish to contact the pharmacy document the reason why and proceed with request.) (Agent: If yes, when and what did the pharmacy advise?)  This is the patient's preferred pharmacy:  [id number] [place] [place] - [address] [place] [address] [place] [address] [place] [address] Phone: [phone] Fax: [phone]  Is this the correct pharmacy for this prescription? Yes If no, delete pharmacy and type the correct one.   Has the prescription been filled recently? Yes  Is the patient out of the medication? Yes  Has the patient been seen for an appointment in the last year OR does the patient have an upcoming appointment? Yes  Can we respond through MyChart? Yes  Agent: Please be advised that Rx refills may take up to 3 business days. We ask that you follow-up with your pharmacy.   ----------------------------------------------------------------------- From previous Reason for Contact - Prescription Issue: Reason for CRM:

## 2023-10-27 NOTE — Telephone Encounter (Signed)
 Patient called office to ask for [facility] [address]

## 2023-11-03 ENCOUNTER — Other Ambulatory Visit: Payer: Self-pay | Admitting: Family Medicine

## 2023-11-03 DIAGNOSIS — F988 Other specified behavioral and emotional disorders with onset usually occurring in childhood and adolescence: Secondary | ICD-10-CM

## 2023-11-03 MED ORDER — METHYLPHENIDATE HCL 5 MG PO TABS: 5.0000 mg | ORAL_TABLET | Freq: Two times a day (BID) | ORAL | 0 refills | Status: DC

## 2023-11-03 NOTE — Telephone Encounter (Signed)
 Requesting: methylphenidate  5mg  Contract: 05/28/21 UDS: 05/28/21 Last Visit: 07/05/23 Next Visit: 12/22/23 Last Refill: 09/16/23 #60 and 0RF  Please Advise

## 2023-11-03 NOTE — Telephone Encounter (Unsigned)
 Copied from CRM [id number]. Topic: Clinical - Medication Refill >> Nov 03, 2023  2:01 PM Mercedes MATSU wrote: Medication: methylphenidate  (RITALIN ) 5 MG tablet   Has the patient contacted their pharmacy? Yes (Agent: If no, request that the patient contact the pharmacy for the refill. If patient does not wish to contact the pharmacy document the reason why and proceed with request.) (Agent: If yes, when and what did the pharmacy advise?)  This is the patient's preferred pharmacy:  [id number] [place], [place] - [address] [place] [place] [address] Phone: [phone] Fax: [phone]  Is this the correct pharmacy for this prescription? yes If no, delete pharmacy and type the correct one.   Has the prescription been filled recently? Yes  Is the patient out of the medication? Yes  Has the patient been seen for an appointment in the last year OR does the patient have an upcoming appointment? Yes  Can we respond through MyChart? Yes  Agent: Please be advised that Rx refills may take up to 3 business days. We ask that you follow-up with your pharmacy.

## 2023-11-16 ENCOUNTER — Telehealth: Payer: Self-pay | Admitting: Allergy

## 2023-11-16 NOTE — Telephone Encounter (Signed)
 LVM informing the patient that we had availability for 11/17 in [place] office and to call us  back if that date works.

## 2023-11-16 NOTE — Telephone Encounter (Signed)
 Landon, when is the next rush opening in [place]? Is there one before the year ends?  If yes, can you schedule this patient?  Thank you.

## 2023-11-16 NOTE — Telephone Encounter (Signed)
 Austin Macdonald would like to be scheduled for RUSH. He said he can do it at whatever office is best between [place], [place], and [facility], and is hoping to have it done by the end of the year for insurance purposes.

## 2023-11-17 NOTE — Telephone Encounter (Signed)
 Spoke with the patient and he wanted to compare dates between [place] and [place] before confirming a date.

## 2023-11-17 NOTE — Telephone Encounter (Signed)
 Pt request a call back, Pt states he is not able to take the 17th

## 2023-11-29 ENCOUNTER — Ambulatory Visit (INDEPENDENT_AMBULATORY_CARE_PROVIDER_SITE_OTHER)

## 2023-11-29 VITALS — BP 156/83 | HR 62 | Temp 98.0°F | Wt 217.0 lb

## 2023-11-29 DIAGNOSIS — R0981 Nasal congestion: Secondary | ICD-10-CM | POA: Diagnosis not present

## 2023-11-29 DIAGNOSIS — J342 Deviated nasal septum: Secondary | ICD-10-CM | POA: Diagnosis not present

## 2023-11-29 DIAGNOSIS — J3089 Other allergic rhinitis: Secondary | ICD-10-CM

## 2023-11-29 DIAGNOSIS — J343 Hypertrophy of nasal turbinates: Secondary | ICD-10-CM | POA: Diagnosis not present

## 2023-11-29 DIAGNOSIS — J3489 Other specified disorders of nose and nasal sinuses: Secondary | ICD-10-CM
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
## 2023-12-07 ENCOUNTER — Other Ambulatory Visit: Payer: Self-pay | Admitting: Family Medicine

## 2023-12-07 DIAGNOSIS — F988 Other specified behavioral and emotional disorders with onset usually occurring in childhood and adolescence: Secondary | ICD-10-CM

## 2023-12-07 NOTE — Telephone Encounter (Unsigned)
 Copied from CRM #[id number]. Topic: Clinical - Medication Refill >> Dec 07, 2023  2:36 PM Mia F wrote: Medication: methylphenidate  (RITALIN ) 5 MG tablet   Has the patient contacted their pharmacy? No (Agent: If no, request that the patient contact the pharmacy for the refill. If patient does not wish to contact the pharmacy document the reason why and proceed with request.) (Agent: If yes, when and what did the pharmacy advise?)  This is the patient's preferred pharmacy:  [id number] [place], [place] - [address] [place] [address] [place] [address] Phone: [phone] Fax: [phone]   Is this the correct pharmacy for this prescription? Yes If no, delete pharmacy and type the correct one.   Has the prescription been filled recently? Yes  Is the patient out of the medication? Yes  Has the patient been seen for an appointment in the last year OR does the patient have an upcoming appointment? Yes  Can we respond through MyChart? Yes  Agent: Please be advised that Rx refills may take up to 3 business days. We ask that you follow-up with your pharmacy.

## 2023-12-07 NOTE — Telephone Encounter (Signed)
 Requesting: methylphenidate  5mg   Contract: 09/15/2018 UDS: 05/28/21 Last Visit: 07/05/23 Next Visit: 12/22/23 Last Refill: 11/03/23 #60 and 0RF   Please Advise

## 2023-12-09 MED ORDER — METHYLPHENIDATE HCL 5 MG PO TABS: 5.0000 mg | ORAL_TABLET | Freq: Two times a day (BID) | ORAL | 0 refills | Status: DC

## 2023-12-16 ENCOUNTER — Encounter: Payer: Self-pay | Admitting: Allergy

## 2023-12-16 ENCOUNTER — Other Ambulatory Visit: Payer: Self-pay | Admitting: Allergy

## 2023-12-16 DIAGNOSIS — J3089 Other allergic rhinitis: Secondary | ICD-10-CM

## 2023-12-16 MED ORDER — EPINEPHRINE 0.3 MG/0.3ML IJ SOAJ: 0.3000 mg | INTRAMUSCULAR | 1 refills | Status: AC | PRN

## 2023-12-16 MED ORDER — MONTELUKAST SODIUM 10 MG PO TABS: 10.0000 mg | ORAL_TABLET | Freq: Every day | ORAL | 3 refills | Status: AC

## 2023-12-16 MED ORDER — PREDNISONE 20 MG PO TABS: ORAL_TABLET | ORAL | 0 refills | Status: AC

## 2023-12-16 NOTE — Progress Notes (Signed)
 Rush medications sent in.

## 2023-12-19 NOTE — Progress Notes (Signed)
 Aeroallergen Immunotherapy  Ordering Provider: Orlan Cramp, DO  Patient Details Name: Austin Macdonald MRN: [id number] Date of Birth: 1955-04-11  Order 2 of 2  Vial Label: Dm-C-D  0.5 ml (Volume)   AU Concentration -- Mite Mix (DF 5,000 & DP 5,000) 0.5 ml (Volume)  1:10 Concentration -- Cat Hair 0.5 ml (Volume)  1:10 Concentration -- Dog Epithelia   1.5  ml Extract Subtotal 3.5  ml Diluent 5.0  ml Maintenance Total  Schedule:  B Silver Vial (1:10,000): RUSH Green Vial (1:1,000): RUSH Blue Vial (1:100): RUSH Yellow Vial (1:10): Schedule B (6 doses) Red Vial (1:1): Schedule A (14 doses)  Special Instructions: May come in 1-2 times a week during build up as tolerated. Once a week on red vial. Once on red vial #1 0.5cc go every 2 weeks, on red vial #2 0.5cc go every 4 weeks. May build up red vials faster (0.1, 0.3, 0.5).

## 2023-12-19 NOTE — Progress Notes (Signed)
 VIALS MADE ON 12/20/23

## 2023-12-19 NOTE — Progress Notes (Signed)
 Aeroallergen Immunotherapy  Ordering Provider: Orlan Cramp, DO  Patient Details Name: Austin Macdonald MRN: [id number] Date of Birth: 30-Sep-1955  Order 1 of 2  Vial Label: G-RW-W-T  0.3 ml (Volume)  BAU Concentration -- 7 Grass Mix* 100,000 ([place]  Blue, [place], [place], Perennial Rye, RedTop, Sweet Vernal, Timothy) 0.2 ml (Volume)  1:20 Concentration -- [place]ia 0.3 ml (Volume)  BAU Concentration -- Bermuda 10,000 0.2 ml (Volume)  1:20 Concentration -- Johnson 0.3 ml (Volume)  1:20 Concentration -- Ragweed Mix 0.2 ml (Volume)  1:40 Concentration -- Baccharis 0.2 ml (Volume)  1:80 Concentration -- Dogfennel 0.5 ml (Volume)  1:20 Concentration -- Weed Mix* 0.5 ml (Volume)  1:20 Concentration -- Eastern 10 Tree Mix* 0.2 ml (Volume)  1:20 Concentration -- Box Elder 0.2 ml (Volume)  1:10 Concentration -- Pecan Pollen 0.2 ml (Volume)  1:20 Concentration -- Walnut, Black Pollen 0.2 ml (Volume)  1:20 Concentration -- Red Mulberry   3.5  ml Extract Subtotal 1.5  ml Diluent 5.0  ml Maintenance Total  Schedule:  B Silver Vial (1:10,000): RUSH Green Vial (1:1,000): RUSH Blue Vial (1:100): RUSH Yellow Vial (1:10): Schedule B (6 doses) Red Vial (1:1): Schedule A (14 doses)  Special Instructions: May come in 1-2 times a week during build up as tolerated. Once a week on red vial. Once on red vial #1 0.5cc go every 2 weeks, on red vial #2 0.5cc go every 4 weeks. May build up red vials faster (0.1, 0.3, 0.5).

## 2023-12-20 ENCOUNTER — Other Ambulatory Visit (INDEPENDENT_AMBULATORY_CARE_PROVIDER_SITE_OTHER): Payer: Self-pay

## 2023-12-20 DIAGNOSIS — J3081 Allergic rhinitis due to animal (cat) (dog) hair and dander: Secondary | ICD-10-CM | POA: Diagnosis not present

## 2023-12-20 DIAGNOSIS — J3089 Other allergic rhinitis: Secondary | ICD-10-CM | POA: Diagnosis not present

## 2023-12-20 DIAGNOSIS — J301 Allergic rhinitis due to pollen: Secondary | ICD-10-CM | POA: Diagnosis not present

## 2023-12-20 MED ORDER — OXYCODONE HCL 5 MG PO TABS: 5.0000 mg | ORAL_TABLET | Freq: Four times a day (QID) | ORAL | 0 refills | Status: AC | PRN

## 2023-12-20 MED ORDER — CEPHALEXIN 500 MG PO CAPS: 500.0000 mg | ORAL_CAPSULE | Freq: Three times a day (TID) | ORAL | 0 refills | Status: DC

## 2023-12-21 NOTE — Assessment & Plan Note (Signed)
Maintain heart healthy diet such as PURE, MIND or DASH diet, increase exercise, avoid trans fats, simple carbohydrates and processed foods, consider fish or flaxseed oil cap daily.

## 2023-12-21 NOTE — Assessment & Plan Note (Signed)
 hgba1c acceptable, minimize simple carbs. Increase exercise as tolerated. Continue current meds

## 2023-12-22 ENCOUNTER — Ambulatory Visit: Admitting: Family Medicine

## 2023-12-22 ENCOUNTER — Encounter: Payer: Self-pay | Admitting: Family Medicine

## 2023-12-22 VITALS — BP 142/92 | HR 59 | Temp 97.7°F | Ht 74.0 in | Wt 220.4 lb

## 2023-12-22 DIAGNOSIS — Z79899 Other long term (current) drug therapy: Secondary | ICD-10-CM

## 2023-12-22 DIAGNOSIS — R739 Hyperglycemia, unspecified: Secondary | ICD-10-CM

## 2023-12-22 DIAGNOSIS — I10 Essential (primary) hypertension: Secondary | ICD-10-CM

## 2023-12-22 DIAGNOSIS — E782 Mixed hyperlipidemia: Secondary | ICD-10-CM

## 2023-12-22 DIAGNOSIS — F988 Other specified behavioral and emotional disorders with onset usually occurring in childhood and adolescence: Secondary | ICD-10-CM

## 2023-12-22 DIAGNOSIS — Z23 Encounter for immunization: Secondary | ICD-10-CM

## 2023-12-22 LAB — COMPREHENSIVE METABOLIC PANEL WITH GFR
ALT: 15 U/L (ref 0–53)
AST: 49 U/L — ABNORMAL HIGH (ref 0–37)
Albumin: 4 g/dL (ref 3.5–5.2)
Alkaline Phosphatase: 54 U/L (ref 39–117)
BUN: 15 mg/dL (ref 6–23)
CO2: 30 meq/L (ref 19–32)
Calcium: 8.8 mg/dL (ref 8.4–10.5)
Chloride: 103 meq/L (ref 96–112)
Creatinine, Ser: 0.64 mg/dL (ref 0.40–1.50)
GFR: 96.15 mL/min (ref >60.00)
Glucose, Bld: 92 mg/dL (ref 70–99)
Potassium: 4 meq/L (ref 3.5–5.1)
Sodium: 140 meq/L (ref 135–145)
Total Bilirubin: 0.7 mg/dL (ref 0.2–1.2)
Total Protein: 6.1 g/dL (ref 6.0–8.3)

## 2023-12-22 LAB — CBC WITH DIFFERENTIAL/PLATELET
Basophils Absolute: 0 K/uL (ref 0.0–0.1)
Basophils Relative: 0.5 % (ref 0.0–3.0)
Eosinophils Absolute: 0.1 K/uL (ref 0.0–0.7)
Eosinophils Relative: 1.9 % (ref 0.0–5.0)
HCT: 41.1 % (ref 39.0–52.0)
Hemoglobin: 13.7 g/dL (ref 13.0–17.0)
Lymphocytes Relative: 28.7 % (ref 12.0–46.0)
Lymphs Abs: 1.8 K/uL (ref 0.7–4.0)
MCHC: 33.3 g/dL (ref 30.0–36.0)
MCV: 91.8 fl (ref 78.0–100.0)
Monocytes Absolute: 0.7 K/uL (ref 0.1–1.0)
Monocytes Relative: 10.5 % (ref 3.0–12.0)
Neutro Abs: 3.7 K/uL (ref 1.4–7.7)
Neutrophils Relative %: 58.4 % (ref 43.0–77.0)
Platelets: 254 K/uL (ref 150.0–400.0)
RBC: 4.48 Mil/uL (ref 4.22–5.81)
RDW: 13.3 % (ref 11.5–15.5)
WBC: 6.3 K/uL (ref 4.0–10.5)

## 2023-12-22 LAB — LIPID PANEL
Cholesterol: 181 mg/dL (ref 0–200)
HDL: 58.9 mg/dL (ref >39.00)
LDL Cholesterol: 97 mg/dL (ref 0–99)
NonHDL: 121.84
Total CHOL/HDL Ratio: 3
Triglycerides: 125 mg/dL (ref 0.0–149.0)
VLDL: 25 mg/dL (ref 0.0–40.0)

## 2023-12-22 LAB — TSH: TSH: 1.5 u[IU]/mL (ref 0.35–5.50)

## 2023-12-22 LAB — HEMOGLOBIN A1C: Hgb A1c MFr Bld: 5.8 % (ref 4.6–6.5)

## 2023-12-22 NOTE — Patient Instructions (Addendum)
 Copy of flu shot to us  Prevnar 20 [facility] pharmacies are all walk in vaccine clinics M-F 9-4  Dehydration, Adult Dehydration is a condition in which there is not enough water or other fluids in the body. This happens when a person loses more fluids than they take in. Important organs, such as the kidneys, brain, and heart, cannot function without a proper amount of fluids. Any loss of fluids from the body can lead to dehydration. Dehydration can be mild, moderate, or severe. It should be treated right away to prevent it from becoming severe. What are the causes? Dehydration may be caused by: Health conditions, such as diarrhea, vomiting, fever, infection, or sweating or urinating a lot. Not drinking enough fluids. Certain medicines, such as medicines that remove excess fluid from the body (diuretics). Lack of safe drinking water. Not being able to get enough water and food. What increases the risk? The following factors may make you more likely to develop this condition: Having a long-term (chronic) illness that has not been treated properly, such as diabetes, heart disease, or kidney disease. Being 70 years of age or older. Having a disability. Living in a place that is high in altitude, where thinner, drier air causes more fluid loss. Doing exercises that put stress on your body for a long time (endurance sports). Being active in a hot climate. What are the signs or symptoms? Symptoms of dehydration depend on how severe it is. Mild or moderate dehydration Thirst. Dry lips or dry mouth. Dizziness or light-headedness. Muscle cramps. Dark urine. Urine may be the color of tea. Less urine or tears produced than usual. Headache. Severe dehydration Changes in skin. Your skin may be cold and clammy, blotchy, or pale. Your skin also may not return to normal after being lightly pinched and released. Little or no tears, urine, or sweat. Rapid breathing and low blood pressure. Your pulse may be  weak or may be faster than 100 beats per minute when you are sitting still. Other changes, such as: Feeling very thirsty. Sunken eyes. Cold hands and feet. Confusion. Being very tired (lethargic) or having trouble waking from sleep. Short-term weight loss. Loss of consciousness. How is this diagnosed? This condition is diagnosed based on your symptoms and a physical exam. You may have blood and urine tests to help confirm the diagnosis. How is this treated? Treatment for this condition depends on how severe it is. Treatment should be started right away. Do not wait until dehydration becomes severe. Severe dehydration is an emergency and needs to be treated in a hospital. Mild or moderate dehydration can be treated at home. You may be asked to: Drink more fluids. Drink an oral rehydration solution (ORS). This drink restores fluids, salts, and minerals in the blood (electrolytes). Stop any activities that caused dehydration, such as exercise. Cool off with cool compresses, cool mist, or cool fluids, if heat or too much sweat caused your condition. Take medicine to treat fever, if fever caused your condition. Take medicine to treat nausea and diarrhea, if vomiting or diarrhea caused your condition. Severe dehydration can be treated: With IV fluids. By correcting abnormal levels of electrolytes in your body. By treating the underlying cause of dehydration. Follow these instructions at home: Oral rehydration solution If told by your health care provider, drink an ORS: Make an ORS by following instructions on the package. Start by drinking small amounts, about  cup (120 mL) every 5-10 minutes. Slowly increase how much you drink until you have taken  the amount recommended by your health care provider.  Eating and drinking  Drink enough clear fluid to keep your urine pale yellow. If you were told to drink an ORS, finish the ORS first and then start slowly drinking other clear fluids. Drink  fluids such as: Water. Do not drink only water. Doing that can lead to hyponatremia, which is having too little salt (sodium) in the body. Water from ice chips you suck on. Diluted fruit juice. This is fruit juice that you have added water to. Low-calorie sports drinks. Eat foods that contain a healthy balance of electrolytes, such as bananas, oranges, potatoes, tomatoes, and spinach. Do not drink alcohol. Avoid the following: Drinks that contain a lot of sugar. These include high-calorie sports drinks, fruit juice that is not diluted, and soda. Caffeine. Foods that are greasy or contain a lot of fat or sugar. General instructions Take over-the-counter and prescription medicines only as told by your health care provider. Do not take sodium tablets. Doing that can lead to having too much sodium in the body (hypernatremia). Return to your normal activities as told by your health care provider. Ask your health care provider what activities are safe for you. Keep all follow-up visits. Your health care provider may need to check your progress and suggest new ways to treat your condition. Contact a health care provider if: You have muscle cramps, pain, or discomfort, such as: Pain in your abdomen and the pain gets worse or stays in one area. Stiff neck. You have a rash. You are more irritable than usual. You are sleepier or have a harder time waking. You feel weak or dizzy. You feel very thirsty. Get help right away if: You have symptoms of severe dehydration. You vomit every time you eat or drink. Your vomiting gets worse, does not go away, or includes blood or green matter (bile). You are getting treatment but symptoms are getting worse. You have a fever. You have a severe headache. You have: Diarrhea that gets worse or does not go away. Blood in your stool. This may cause stool to look black and tarry. Not urinating, or urinating only a small amount of very dark urine, within 6-8  hours. You have trouble breathing. These symptoms may be an emergency. Get help right away. Do not wait to see if the symptoms will go away. Do not drive yourself to the hospital. Call 911. This information is not intended to replace advice given to you by your health care provider. Make sure you discuss any questions you have with your health care provider. Document Revised: 07/27/2021 Document Reviewed: 07/27/2021 Elsevier Patient Education  2024 [employer].

## 2023-12-23 ENCOUNTER — Ambulatory Visit: Payer: Self-pay | Admitting: Family Medicine

## 2023-12-25 ENCOUNTER — Encounter: Payer: Self-pay | Admitting: Family Medicine

## 2023-12-25 LAB — DRUG MONITORING, PANEL 8 WITH CONFIRMATION, URINE
6 Acetylmorphine: NEGATIVE ng/mL (ref <10)
Alcohol Metabolites: POSITIVE ng/mL — AB (ref <500)
Amphetamines: NEGATIVE ng/mL (ref <500)
Benzodiazepines: NEGATIVE ng/mL (ref <100)
Buprenorphine, Urine: NEGATIVE ng/mL (ref <5)
Cocaine Metabolite: NEGATIVE ng/mL (ref <150)
Creatinine: 31.1 mg/dL (ref >=20.0)
Ethyl Glucuronide (ETG): 1358 ng/mL — ABNORMAL HIGH (ref <500)
Ethyl Sulfate (ETS): 290 ng/mL — ABNORMAL HIGH (ref <100)
MDMA: NEGATIVE ng/mL (ref <500)
Marijuana Metabolite: NEGATIVE ng/mL (ref <20)
Noroxycodone: 57 ng/mL — ABNORMAL HIGH (ref <50)
Opiates: NEGATIVE ng/mL (ref <100)
Oxidant: NEGATIVE ug/mL (ref <200)
Oxycodone: NEGATIVE ng/mL (ref <50)
Oxycodone: POSITIVE ng/mL — AB (ref <100)
Oxymorphone: 170 ng/mL — ABNORMAL HIGH (ref <50)
pH: 7.2 (ref 4.5–9.0)

## 2023-12-25 LAB — DM TEMPLATE

## 2023-12-25 LAB — PRESCRIBED DRUGS,MEDMATCH(R)

## 2023-12-25 NOTE — Progress Notes (Signed)
 Subjective:    Patient ID: Austin Macdonald, male    DOB: 1955/08/15, 68 y.o.   MRN: [id number]  No chief complaint on file.   HPI Discussed the use of AI scribe software for clinical note transcription with the patient, who gave verbal consent to proceed.  History of Present Illness Austin Macdonald is a 68 year old male who presents for a follow-up visit after having his nose splinted due to epistaxis.  He has experienced cessation of epistaxis following the splinting procedure. Currently, he is breathing primarily through his mouth and is unable to blow his nose until Saturday. He uses nasal saline to facilitate breathing.  He acknowledges suboptimal hydration, particularly due to mouth breathing, which can increase water loss. He is attempting to improve hydration by using a larger cup to increase water intake throughout the day.  His blood pressure typically ranges from 130-135/78-82 mmHg, with a recent reading of 120/80 mmHg. He is taking methylphenidate , which he took this morning, and completed a course of steroids one to two weeks ago. He is scheduled for rapid desensitization in two weeks.  [facility] flu [facility] and the RSV vaccine in December 2023.  He is currently sleeping in a chair due to his condition and anticipates the removal of stitches to improve breathing comfort. He engages in language lessons to maintain cognitive health.    Past Medical History:  Diagnosis Date   ADD (attention deficit disorder) 12/30/2015   Benign paroxysmal positional vertigo 12/26/2014   Chicken pox as a child   Colon polyps    Elevated BP 03/03/2015   Hay fever    seasonal, spring, fall   Hyperglycemia 03/03/2015   Measles as a child   Mumps as a child   Preventative health care 07/25/2012   Tinnitus 12/29/2014    Past Surgical History:  Procedure Laterality Date   dental implants     knee arthroscopic repair  01/11/2002   right    Family History  Problem  Relation Age of Onset   Hypertension Mother    Heart disease Mother        CHF   Dementia Father    Other Paternal Grandfather        black lung   Thyroid  cancer Daughter    Allergic rhinitis Neg Hx    Angioedema Neg Hx    Atopy Neg Hx    Asthma Neg Hx    Immunodeficiency Neg Hx    Eczema Neg Hx    Urticaria Neg Hx     Social History   Socioeconomic History   Marital status: Married    Spouse name: Not on file   Number of children: Not on file   Years of education: Not on file   Highest education level: Bachelor's degree (e.g., BA, AB, BS)  Occupational History   Not on file  Tobacco Use   Smoking status: Former    Types: Cigarettes    Passive exposure: Past   Smokeless tobacco: Never  Vaping Use   Vaping status: Never Used  Substance and Sexual Activity   Alcohol use: Yes    Comment: 10-12 drinks weekly   Drug use: No   Sexual activity: Yes    Comment: lives with wife, travels for work Editor, Commissioning, no major dietary restrictions  Other Topics Concern   Not on file  Social History Narrative   Not on file   Social Drivers of Health   Tobacco Use: Medium  Risk (10/13/2023)   Patient History    Smoking Tobacco Use: Former    Smokeless Tobacco Use: Never    Passive Exposure: Past  Physicist, Medical Strain: Low Risk (12/21/2023)   Overall Financial Resource Strain (CARDIA)    Difficulty of Paying Living Expenses: Not hard at all  Food Insecurity: No Food Insecurity (12/21/2023)   Epic    Worried About Programme Researcher, Broadcasting/film/video in the Last Year: Never true    Ran Out of Food in the Last Year: Never true  Transportation Needs: No Transportation Needs (12/21/2023)   Epic    Lack of Transportation (Medical): No    Lack of Transportation (Non-Medical): No  Physical Activity: Sufficiently Active (12/21/2023)   Exercise Vital Sign    Days of Exercise per Week: 5 days    Minutes of Exercise per Session: 50 min  Stress: No Stress Concern Present (12/21/2023)   [employer] - Occupational Stress Questionnaire    Feeling of Stress: Not at all  Social Connections: Moderately Integrated (12/21/2023)   Social Connection and Isolation Panel    Frequency of Communication with Friends and Family: More than three times a week    Frequency of Social Gatherings with Friends and Family: Twice a week    Attends Religious Services: Patient declined    Database Administrator or Organizations: Yes    Attends Engineer, Structural: More than 4 times per year    Marital Status: Married  Catering Manager Violence: Not on file  Depression (PHQ2-9): Low Risk (12/22/2023)   Depression (PHQ2-9)    PHQ-2 Score: 0  Alcohol Screen: Low Risk (12/21/2023)   Alcohol Screen    Last Alcohol Screening Score (AUDIT): 4  Housing: Unknown (12/21/2023)   Epic    Unable to Pay for Housing in the Last Year: No    Number of Times Moved in the Last Year: Not on file    Homeless in the Last Year: No  Utilities: Not on file  Health Literacy: Not on file    Outpatient Medications Prior to Visit  Medication Sig Dispense Refill   cephALEXin  (KEFLEX ) 500 MG capsule Take 1 capsule (500 mg total) by mouth 3 (three) times daily for 8 days. 24 capsule 0   EPINEPHrine  0.3 mg/0.3 mL IJ SOAJ injection Inject 0.3 mg into the muscle as needed for anaphylaxis. 2 each 1   Fluticasone  Propionate (XHANCE ) 93 MCG/ACT EXHU 1-2 sprays per nostril twice a day. 16 mL 5   ibuprofen  (ADVIL ) 200 MG tablet Take 1 tablet (200 mg total) by mouth every 6 (six) hours as needed for pain. 30 tablet 0   methylphenidate  (RITALIN ) 5 MG tablet Take 1 tablet (5 mg total) by mouth 2 (two) times daily with breakfast and lunch. 60 tablet 0   montelukast  (SINGULAIR ) 10 MG tablet Take 1 tablet (10 mg total) by mouth at bedtime. 30 tablet 3   oxyCODONE  (ROXICODONE ) 5 MG immediate release tablet Take 1 tablet (5 mg total) by mouth every 6 (six) hours as needed for severe pain (pain score 7-10)  (for pain not controlled with Tylenol/Motrin  use over the counter stool softeners while taking this medication). 30 tablet 0   predniSONE  (DELTASONE ) 20 MG tablet Take 2 tablets the day before rush immunotherapy and take 2 tablets the day of rush. 4 tablet 0   levocetirizine (XYZAL) 5 MG tablet Take 5 mg by mouth every evening. (Patient not taking: Reported on 12/22/2023)  No facility-administered medications prior to visit.    Allergies[1]  Review of Systems  Constitutional:  Negative for fever and malaise/fatigue.  HENT:  Positive for nosebleeds. Negative for congestion.   Eyes:  Negative for blurred vision.  Respiratory:  Negative for shortness of breath.   Cardiovascular:  Negative for chest pain, palpitations and leg swelling.  Gastrointestinal:  Negative for abdominal pain, blood in stool and nausea.  Genitourinary:  Negative for dysuria and frequency.  Musculoskeletal:  Negative for falls.  Skin:  Negative for rash.  Neurological:  Negative for dizziness, loss of consciousness and headaches.  Endo/Heme/Allergies:  Negative for environmental allergies.  Psychiatric/Behavioral:  Negative for depression. The patient is not nervous/anxious.        Objective:    Physical Exam Vitals reviewed.  Constitutional:      Appearance: Normal appearance. He is not ill-appearing.  HENT:     Head: Normocephalic and atraumatic.     Nose: Nose normal.  Eyes:     Conjunctiva/sclera: Conjunctivae normal.  Cardiovascular:     Rate and Rhythm: Normal rate.     Pulses: Normal pulses.     Heart sounds: Normal heart sounds. No murmur heard. Pulmonary:     Effort: Pulmonary effort is normal.     Breath sounds: Normal breath sounds. No wheezing.  Abdominal:     Palpations: Abdomen is soft. There is no mass.     Tenderness: There is no abdominal tenderness.  Musculoskeletal:     Cervical back: Normal range of motion.     Right lower leg: No edema.     Left lower leg: No edema.  Skin:     General: Skin is warm and dry.  Neurological:     General: No focal deficit present.     Mental Status: He is alert and oriented to person, place, and time.  Psychiatric:        Mood and Affect: Mood normal.     BP (!) 142/92 (BP Location: Left Arm, Patient Position: Sitting, Cuff Size: Large)   Pulse (!) 59   Temp 97.7 F (36.5 C) (Oral)   Ht 6' 2 (1.88 m)   Wt 220 lb 6.4 oz (100 kg)   SpO2 97%   BMI 28.30 kg/m  Wt Readings from Last 3 Encounters:  12/22/23 220 lb 6.4 oz (100 kg)  11/29/23 217 lb (98.4 kg)  10/13/23 217 lb (98.4 kg)    Diabetic Foot Exam - Simple   No data filed    Lab Results  Component Value Date   WBC 6.3 12/22/2023   HGB 13.7 12/22/2023   HCT 41.1 12/22/2023   PLT 254.0 12/22/2023   GLUCOSE 92 12/22/2023   CHOL 181 12/22/2023   TRIG 125.0 12/22/2023   HDL 58.90 12/22/2023   LDLDIRECT 112.0 08/28/2018   LDLCALC 97 12/22/2023   ALT 15 12/22/2023   AST 49 (H) 12/22/2023   NA 140 12/22/2023   K 4.0 12/22/2023   CL 103 12/22/2023   CREATININE 0.64 12/22/2023   BUN 15 12/22/2023   CO2 30 12/22/2023   TSH 1.50 12/22/2023   PSA 2.4 11/08/2022   HGBA1C 5.8 12/22/2023    Lab Results  Component Value Date   TSH 1.50 12/22/2023   Lab Results  Component Value Date   WBC 6.3 12/22/2023   HGB 13.7 12/22/2023   HCT 41.1 12/22/2023   MCV 91.8 12/22/2023   PLT 254.0 12/22/2023   Lab Results  Component Value Date  NA 140 12/22/2023   K 4.0 12/22/2023   CO2 30 12/22/2023   GLUCOSE 92 12/22/2023   BUN 15 12/22/2023   CREATININE 0.64 12/22/2023   BILITOT 0.7 12/22/2023   ALKPHOS 54 12/22/2023   AST 49 (H) 12/22/2023   ALT 15 12/22/2023   PROT 6.1 12/22/2023   ALBUMIN 4.0 12/22/2023   CALCIUM 8.8 12/22/2023   EGFR 97.0 11/08/2022   EGFR 97 11/08/2022   GFR 96.15 12/22/2023   Lab Results  Component Value Date   CHOL 181 12/22/2023   Lab Results  Component Value Date   HDL 58.90 12/22/2023   Lab Results  Component Value  Date   LDLCALC 97 12/22/2023   Lab Results  Component Value Date   TRIG 125.0 12/22/2023   Lab Results  Component Value Date   CHOLHDL 3 12/22/2023   Lab Results  Component Value Date   HGBA1C 5.8 12/22/2023       Assessment & Plan:  Hyperlipidemia, mixed Assessment & Plan: Maintain heart healthy diet such as PURE, MIND or DASH diet, increase exercise, avoid trans fats, simple carbohydrates and processed foods, consider fish or flaxseed oil cap daily.    Orders: -     Lipid panel -     TSH  Hyperglycemia Assessment & Plan: hgba1c acceptable, minimize simple carbs. Increase exercise as tolerated. Continue current meds   Orders: -     Hemoglobin A1c  Attention deficit disorder (ADD) without hyperactivity -     DRUG MONITORING, PANEL 8 WITH CONFIRMATION, URINE -     PRESCRIBED DRUGS,MEDMATCH(R)  Hypertension, unspecified type -     CBC with Differential/Platelet -     Comprehensive metabolic panel with GFR  High risk medication use -     DRUG MONITORING, PANEL 8 WITH CONFIRMATION, URINE -     PRESCRIBED DRUGS,MEDMATCH(R)  Need for pneumococcal 20-valent conjugate vaccination -     Pneumococcal conjugate vaccine 20-valent  Other orders -     DM TEMPLATE    Assessment and Plan Assessment & Plan Essential hypertension Blood pressure is slightly elevated, possibly due to methylphenidate  use. Home readings are generally within acceptable range (130-135/78-82 mmHg). - Monitor blood pressure at home regularly. - Maintain hydration and rest. - Will consider low-dose beta blocker or calcium channel blocker if blood pressure consistently exceeds 140 mmHg.  Attention deficit disorder without hyperactivity Currently managed with methylphenidate . Discussed potential side effect of increased blood pressure with aging. - Continue current methylphenidate  regimen.  General Health Maintenance Discussed importance of hydration, especially with aging. Reviewed vaccination  status and recommended Prevnar 20 for pneumococcal protection. Discussed flu shot documentation and RSV vaccination status. - Administered Prevnar 20 vaccine today. - Obtain documentation of flu shot from pharmacy. - Continue to monitor RSV vaccination status; no booster needed at this time.  Recording duration: 14 minutes     Harlene Horton, MD    [1] No Known Allergies

## 2023-12-26 ENCOUNTER — Ambulatory Visit (INDEPENDENT_AMBULATORY_CARE_PROVIDER_SITE_OTHER)

## 2023-12-26 ENCOUNTER — Encounter (INDEPENDENT_AMBULATORY_CARE_PROVIDER_SITE_OTHER): Payer: Self-pay

## 2023-12-26 VITALS — BP 155/86 | HR 65 | Temp 98.0°F | Wt 220.0 lb

## 2023-12-26 DIAGNOSIS — J342 Deviated nasal septum: Secondary | ICD-10-CM

## 2023-12-26 MED ORDER — CEPHALEXIN 500 MG PO CAPS: 500.0000 mg | ORAL_CAPSULE | Freq: Three times a day (TID) | ORAL | 0 refills | Status: AC

## 2023-12-26 NOTE — Progress Notes (Signed)
 Discussed the use of AI scribe software for clinical note transcription with the patient, who gave verbal consent to proceed.  History of Present Illness Austin Macdonald is a 68 year old male with severe nasal septal deviation and turbinate hypertrophy who presents for postoperative follow-up after nasal septal surgery.  Postoperative symptom trajectory - Underwent nasal septal surgery for severe septal deviation and turbinate hypertrophy. - Immediate postoperative period characterized by discomfort without significant pain. - Progressive improvement in symptoms beginning on postoperative day one. - Able to sleep in a recliner for five to six hours at a time before waking.  Nasal crusting and hygiene - Persistent nasal crusting following surgery. - Uses frequent saline spray to manage crusting and maintain nasal hygiene.  Pain and trauma - No significant pain postoperatively. - No nasal trauma postoperatively, including after a minor incident involving his head that did not impact the nose.  Activity considerations - Plans to travel to [place] and participate in golfing activities. - Aware of the need to avoid facial trauma during this period.  Physical Exam HEENT: splints in place. Crusting present.    Assessment & Plan Status post septoplasty and turbinate reduction Recovering well from surgery for severe septal deviation and turbinate hypertrophy. Improved nasal breathing, minimal discomfort, and appropriate healing. Prolonged splint retention recommended for optimal septal healing. - Continued frequent nasal saline irrigations to promote healing and reduce crusting. - Maintain nasal splints for an additional week to optimize septal healing. - Scheduled follow-up for splint removal next week, flexible for Monday or Wednesday. - Instructed to avoid nasal trauma and exercise caution during activities such as golfing. - Clarified that splints are non-metallic and will  not affect airport security.  Nasal septal deviation Severe nasal septal deviation surgically corrected with substantial septal repositioning. Improved nasal airflow postoperatively. - Continue current postoperative care as above. - additional antibiotic sent to pharmacy.   Hypertrophy of inferior nasal turbinates Inferior turbinate hypertrophy addressed during recent surgery. Healing progressing with reduced congestion and improved breathing. - Continue current postoperative care as above.  F/u next monday

## 2024-01-02 ENCOUNTER — Ambulatory Visit (INDEPENDENT_AMBULATORY_CARE_PROVIDER_SITE_OTHER)

## 2024-01-02 ENCOUNTER — Encounter (INDEPENDENT_AMBULATORY_CARE_PROVIDER_SITE_OTHER): Payer: Self-pay

## 2024-01-02 VITALS — BP 145/76 | HR 62 | Temp 98.0°F | Wt 220.0 lb

## 2024-01-02 DIAGNOSIS — J342 Deviated nasal septum: Secondary | ICD-10-CM

## 2024-01-02 DIAGNOSIS — Z09 Encounter for follow-up examination after completed treatment for conditions other than malignant neoplasm: Secondary | ICD-10-CM

## 2024-01-02 NOTE — Progress Notes (Signed)
 Discussed the use of AI scribe software for clinical note transcription with the patient, who gave verbal consent to proceed.  History of Present Illness Austin Macdonald is a 68 year old male with severe nasal septal deviation status post recent septoplasty and turbinate reduction who presents for postoperative follow-up.  Nasal obstruction and airflow - Severe nasal septal deviation treated with recent septoplasty and turbinate reduction. - Longstanding inability to breathe through nose prior to surgery. - Dramatic improvement in nasal airflow postoperatively, described as hundred percent change. - Able to breathe through nose for the first time in years.  Postoperative course and symptom management - On first postoperative night, experienced difficulty breathing nasally while supine, required oral breathing, resulting in significant dryness. - Able to breathe nasally since first night and feels well overall. - Able to gently blow nose without difficulty. - Recently traveled to [place] without complications related to nasal surgery.  Nasal trauma history - History of nasal trauma including laceration of nostril with shovel handle. 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 - Uncertain if prior injuries contributed to nasal deformity.  Postoperative medication and care - Using saline nasal spray, including aerosolized formulation, carefully directed laterally to avoid septal trauma. - No use of intranasal corticosteroids since surgery, as advised. - Continues oral allergy  medications as needed.  Physical Exam HEENT: Atraumatic, normocephalic. Nose healing well. Splints removed. Septum straight. Healing well. No perforation.   Assessment & Plan Postoperative care following septoplasty and turbinate reduction Postoperative period following septoplasty and turbinate reduction for severe nasal septal deviation and turbinate hypertrophy.  Septum straightened with improved nasal airway. Healing ongoing, no infection or acute complications. Full healing anticipated with conservative management. No perforation - Confirmed appropriate healing with nasal splints still in place. - Advised continued use of saline nasal spray, directing laterally to avoid septal trauma. - Instructed to avoid intranasal corticosteroids until end of January to prevent delayed mucosal healing. - Permitted use of oral antihistamines as needed for allergy  symptoms. - Advised gentle nose blowing only. - Recommended deferring nasal lavage and non-saline sprays until end of January. - Offered follow-up evaluation in 2-3 months, or sooner if symptoms worsen or earlier assessment desired.

## 2024-01-03 ENCOUNTER — Ambulatory Visit: Admitting: Internal Medicine

## 2024-01-03 VITALS — BP 138/82 | HR 65 | Temp 97.8°F | Resp 18

## 2024-01-03 DIAGNOSIS — J3089 Other allergic rhinitis: Secondary | ICD-10-CM | POA: Diagnosis not present

## 2024-01-03 DIAGNOSIS — J302 Other seasonal allergic rhinitis: Secondary | ICD-10-CM | POA: Diagnosis not present

## 2024-01-03 NOTE — Progress Notes (Signed)
 "  RAPID DESENSITIZATION Note  RE: Austin Macdonald MRN: [id number] DOB: 09-Mar-1955 Date of Office Visit: 01/03/2024  Subjective:  Patient presents today for rapid desensitization.  Interval History: Patient has not been ill, he has taken all premedications as per protocol.  Recent/Current History: Pulmonary disease: no Cardiac disease: no Respiratory infection: no Rash: no Itch: no Swelling: no Cough: no Shortness of breath: no Runny/stuffy nose: no Itchy eyes: no Beta-blocker use: no  Patient/guardian was informed of the procedure with verbalized understanding of the risk of anaphylaxis. Consent has been signed.   Medication List:  Current Outpatient Medications  Medication Sig Dispense Refill   EPINEPHrine  0.3 mg/0.3 mL IJ SOAJ injection Inject 0.3 mg into the muscle as needed for anaphylaxis. 2 each 1   Fluticasone  Propionate (XHANCE ) 93 MCG/ACT EXHU 1-2 sprays per nostril twice a day. 16 mL 5   ibuprofen  (ADVIL ) 200 MG tablet Take 1 tablet (200 mg total) by mouth every 6 (six) hours as needed for pain. 30 tablet 0   levocetirizine (XYZAL) 5 MG tablet Take 5 mg by mouth every evening. (Patient not taking: Reported on 01/02/2024)     methylphenidate  (RITALIN ) 5 MG tablet Take 1 tablet (5 mg total) by mouth 2 (two) times daily with breakfast and lunch. 60 tablet 0   montelukast  (SINGULAIR ) 10 MG tablet Take 1 tablet (10 mg total) by mouth at bedtime. 30 tablet 3   oxyCODONE  (ROXICODONE ) 5 MG immediate release tablet Take 1 tablet (5 mg total) by mouth every 6 (six) hours as needed for severe pain (pain score 7-10) (for pain not controlled with Tylenol/Motrin  use over the counter stool softeners while taking this medication). 30 tablet 0   predniSONE  (DELTASONE ) 20 MG tablet Take 2 tablets the day before rush immunotherapy and take 2 tablets the day of rush. 4 tablet 0   No current facility-administered medications for this visit.   Allergies: Allergies[1] I reviewed his  past medical history, social history, family history, and environmental history and no significant changes have been reported from his previous visit.  ROS: Negative except as per HPI.  Objective: BP 138/82 (BP Location: Right Arm, Patient Position: Sitting, Cuff Size: Normal)   Pulse 65   Temp 97.8 F (36.6 C) (Oral)   Resp 18   SpO2 98%  There is no height or weight on file to calculate BMI.   General Appearance:  Alert, cooperative, no distress, appears stated age  Head:  Normocephalic, without obvious abnormality, atraumatic  Eyes:  Conjunctiva clear, EOM's intact  Nose: Nares normal  Throat: Lips, tongue normal; teeth and gums normal, normal  posterior oropharnyx  Neck: Supple, symmetrical  Lungs:   CTAB , Respirations unlabored, no coughing  Heart:  Appears well perfused  Extremities: No edema  Skin: Skin color, texture, turgor normal, no rashes or lesions on visualized portions of skin  Neurologic: No gross deficits     Diagnostics: None done   PROCEDURES:  Step 1:  0.15ml - 1:10,000 dilution (silver vial) Step 2:  0.3ml - 1:10,000 dilution (silver vial) Step 3: 0.1ml - 1:1,000 dilution (green vial)  Step 4: 0.61ml - 1:1,000 dilution (green vial)  Step 5: 0.36ml - 1:100 dilution (blue vial) Step 6: 0.2ml - 1:100 dilution (blue vial) Step 7: 0.3ml - 1:100 dilution (blue vial) Step 8: 0.79ml - 1:100 dilution (blue vial)  Patient was observed for 1 hour after the last dose.   Procedure started at 8:30 Procedure ended at 4:30   ASSESSMENT/PLAN:  Patient has tolerated the rapid desensitization protocol.  Next appointment: Start at 0.05ml of 1:10 dilution (yellow vial) and build up per protocol.     [1] No Known Allergies  "

## 2024-01-10 ENCOUNTER — Ambulatory Visit (INDEPENDENT_AMBULATORY_CARE_PROVIDER_SITE_OTHER)

## 2024-01-10 DIAGNOSIS — J302 Other seasonal allergic rhinitis: Secondary | ICD-10-CM

## 2024-01-10 DIAGNOSIS — J3089 Other allergic rhinitis: Secondary | ICD-10-CM

## 2024-01-16 DIAGNOSIS — F988 Other specified behavioral and emotional disorders with onset usually occurring in childhood and adolescence: Secondary | ICD-10-CM

## 2024-01-16 MED ORDER — METHYLPHENIDATE HCL 5 MG PO TABS: 5.0000 mg | ORAL_TABLET | Freq: Two times a day (BID) | ORAL | 0 refills | Status: AC

## 2024-01-16 NOTE — Telephone Encounter (Signed)
 Requesting: methylphenidate  5mg   Contract: 12/22/23 UDS: 12/22/23 Last Visit: 12/22/23 Next Visit: 06/25/24 Last Refill: 12/09/23 #60 and 0RF   Please Advise

## 2024-01-16 NOTE — Telephone Encounter (Signed)
 Copied from CRM [id number]. Topic: Clinical - Medication Refill >> Jan 16, 2024  8:37 AM Wess RAMAN wrote: Medication: methylphenidate  (RITALIN ) 5 MG tablet   Has the patient contacted their pharmacy? No (Agent: If no, request that the patient contact the pharmacy for the refill. If patient does not wish to contact the pharmacy document the reason why and proceed with request.) (Agent: If yes, when and what did the pharmacy advise?)  This is the patient's preferred pharmacy:  [id number] - [place], [place] - [address] [place] [address] [place] [address] [place] [address] Phone: [phone] Fax: [phone]  Is this the correct pharmacy for this prescription? Yes If no, delete pharmacy and type the correct one.   Has the prescription been filled recently? Yes  Is the patient out of the medication? Yes  Has the patient been seen for an appointment in the last year OR does the patient have an upcoming appointment? Yes  Can we respond through MyChart? Yes  Agent: Please be advised that Rx refills may take up to 3 business days. We ask that you follow-up with your pharmacy.

## 2024-01-17 DIAGNOSIS — J302 Other seasonal allergic rhinitis: Secondary | ICD-10-CM

## 2024-01-24 ENCOUNTER — Ambulatory Visit

## 2024-01-24 DIAGNOSIS — J302 Other seasonal allergic rhinitis: Secondary | ICD-10-CM | POA: Diagnosis not present

## 2024-01-31 ENCOUNTER — Ambulatory Visit (INDEPENDENT_AMBULATORY_CARE_PROVIDER_SITE_OTHER)

## 2024-01-31 DIAGNOSIS — J302 Other seasonal allergic rhinitis: Secondary | ICD-10-CM | POA: Diagnosis not present

## 2024-02-09 ENCOUNTER — Ambulatory Visit (INDEPENDENT_AMBULATORY_CARE_PROVIDER_SITE_OTHER)

## 2024-02-09 DIAGNOSIS — J302 Other seasonal allergic rhinitis: Secondary | ICD-10-CM

## 2024-03-12 ENCOUNTER — Ambulatory Visit: Admitting: Family Medicine

## 2024-04-12 ENCOUNTER — Ambulatory Visit: Admitting: Allergy

## 2024-04-19 ENCOUNTER — Ambulatory Visit: Admitting: Allergy

## 2024-06-25 ENCOUNTER — Encounter: Admitting: Family Medicine
